# Patient Record
Sex: Male | Born: 1965
Health system: Southern US, Community
[De-identification: ages and names within clinical notes are randomized; demographics above are authoritative.]

## PROBLEM LIST (undated history)

## (undated) DIAGNOSIS — E119 Type 2 diabetes mellitus without complications: Secondary | ICD-10-CM

## (undated) DIAGNOSIS — I10 Essential (primary) hypertension: Secondary | ICD-10-CM

## (undated) DIAGNOSIS — I429 Cardiomyopathy, unspecified: Secondary | ICD-10-CM

## (undated) DIAGNOSIS — E08311 Diabetes mellitus due to underlying condition with unspecified diabetic retinopathy with macular edema: Secondary | ICD-10-CM

## (undated) DIAGNOSIS — R410 Disorientation, unspecified: Secondary | ICD-10-CM

## (undated) DIAGNOSIS — M542 Cervicalgia: Secondary | ICD-10-CM

## (undated) DIAGNOSIS — J449 Chronic obstructive pulmonary disease, unspecified: Secondary | ICD-10-CM

## (undated) DIAGNOSIS — E785 Hyperlipidemia, unspecified: Secondary | ICD-10-CM

## (undated) DIAGNOSIS — E782 Mixed hyperlipidemia: Secondary | ICD-10-CM

## (undated) DIAGNOSIS — M5412 Radiculopathy, cervical region: Secondary | ICD-10-CM

## (undated) HISTORY — DX: Essential (primary) hypertension: I10

## (undated) HISTORY — DX: Type 2 diabetes mellitus without complications: E11.9

## (undated) HISTORY — DX: Mixed hyperlipidemia: E78.2

## (undated) HISTORY — DX: Cardiomyopathy, unspecified: I42.9

## (undated) HISTORY — DX: Disorientation, unspecified: R41.0

## (undated) HISTORY — PX: CHOLECYSTECTOMY: SHX55

---
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
## 2013-12-17 DIAGNOSIS — L0231 Cutaneous abscess of buttock: Secondary | ICD-10-CM | POA: Diagnosis not present

## 2013-12-17 DIAGNOSIS — L03317 Cellulitis of buttock: Secondary | ICD-10-CM | POA: Diagnosis not present

## 2013-12-17 DIAGNOSIS — Z794 Long term (current) use of insulin: Secondary | ICD-10-CM | POA: Diagnosis not present

## 2013-12-17 DIAGNOSIS — E119 Type 2 diabetes mellitus without complications: Secondary | ICD-10-CM | POA: Diagnosis not present

## 2013-12-17 DIAGNOSIS — F17200 Nicotine dependence, unspecified, uncomplicated: Secondary | ICD-10-CM | POA: Diagnosis not present

## 2013-12-19 DIAGNOSIS — Z4800 Encounter for change or removal of nonsurgical wound dressing: Secondary | ICD-10-CM | POA: Diagnosis not present

## 2013-12-21 DIAGNOSIS — Z8249 Family history of ischemic heart disease and other diseases of the circulatory system: Secondary | ICD-10-CM | POA: Diagnosis not present

## 2013-12-21 DIAGNOSIS — L0231 Cutaneous abscess of buttock: Secondary | ICD-10-CM | POA: Diagnosis not present

## 2013-12-21 DIAGNOSIS — Z794 Long term (current) use of insulin: Secondary | ICD-10-CM | POA: Diagnosis not present

## 2013-12-21 DIAGNOSIS — L03317 Cellulitis of buttock: Secondary | ICD-10-CM | POA: Diagnosis not present

## 2013-12-21 DIAGNOSIS — Z833 Family history of diabetes mellitus: Secondary | ICD-10-CM | POA: Diagnosis not present

## 2013-12-21 DIAGNOSIS — I10 Essential (primary) hypertension: Secondary | ICD-10-CM | POA: Diagnosis not present

## 2013-12-21 DIAGNOSIS — E109 Type 1 diabetes mellitus without complications: Secondary | ICD-10-CM | POA: Diagnosis not present

## 2013-12-21 DIAGNOSIS — F17200 Nicotine dependence, unspecified, uncomplicated: Secondary | ICD-10-CM | POA: Diagnosis not present

## 2013-12-21 DIAGNOSIS — Z4800 Encounter for change or removal of nonsurgical wound dressing: Secondary | ICD-10-CM | POA: Diagnosis not present

## 2013-12-25 DIAGNOSIS — K5900 Constipation, unspecified: Secondary | ICD-10-CM | POA: Diagnosis not present

## 2013-12-25 DIAGNOSIS — I10 Essential (primary) hypertension: Secondary | ICD-10-CM | POA: Diagnosis not present

## 2013-12-25 DIAGNOSIS — Z794 Long term (current) use of insulin: Secondary | ICD-10-CM | POA: Diagnosis not present

## 2013-12-25 DIAGNOSIS — E109 Type 1 diabetes mellitus without complications: Secondary | ICD-10-CM | POA: Diagnosis not present

## 2013-12-25 DIAGNOSIS — F17200 Nicotine dependence, unspecified, uncomplicated: Secondary | ICD-10-CM | POA: Diagnosis not present

## 2013-12-25 DIAGNOSIS — Z8249 Family history of ischemic heart disease and other diseases of the circulatory system: Secondary | ICD-10-CM | POA: Diagnosis not present

## 2013-12-25 DIAGNOSIS — Z833 Family history of diabetes mellitus: Secondary | ICD-10-CM | POA: Diagnosis not present

## 2013-12-25 DIAGNOSIS — R1084 Generalized abdominal pain: Secondary | ICD-10-CM | POA: Diagnosis not present

## 2015-01-21 DIAGNOSIS — Z794 Long term (current) use of insulin: Secondary | ICD-10-CM | POA: Diagnosis not present

## 2015-01-21 DIAGNOSIS — H5441 Blindness, right eye, normal vision left eye: Secondary | ICD-10-CM | POA: Diagnosis not present

## 2015-01-21 DIAGNOSIS — Z833 Family history of diabetes mellitus: Secondary | ICD-10-CM | POA: Diagnosis not present

## 2015-01-21 DIAGNOSIS — F17200 Nicotine dependence, unspecified, uncomplicated: Secondary | ICD-10-CM | POA: Diagnosis not present

## 2015-01-21 DIAGNOSIS — I10 Essential (primary) hypertension: Secondary | ICD-10-CM | POA: Diagnosis not present

## 2015-01-21 DIAGNOSIS — H5712 Ocular pain, left eye: Secondary | ICD-10-CM | POA: Diagnosis not present

## 2015-01-21 DIAGNOSIS — E109 Type 1 diabetes mellitus without complications: Secondary | ICD-10-CM | POA: Diagnosis not present

## 2015-01-21 DIAGNOSIS — Z8249 Family history of ischemic heart disease and other diseases of the circulatory system: Secondary | ICD-10-CM | POA: Diagnosis not present

## 2015-01-22 DIAGNOSIS — H2512 Age-related nuclear cataract, left eye: Secondary | ICD-10-CM | POA: Diagnosis not present

## 2015-02-02 DIAGNOSIS — H2512 Age-related nuclear cataract, left eye: Secondary | ICD-10-CM | POA: Diagnosis not present

## 2015-02-17 DIAGNOSIS — H2512 Age-related nuclear cataract, left eye: Secondary | ICD-10-CM | POA: Diagnosis not present

## 2015-03-10 DIAGNOSIS — H3589 Other specified retinal disorders: Secondary | ICD-10-CM | POA: Diagnosis not present

## 2015-03-10 DIAGNOSIS — H2589 Other age-related cataract: Secondary | ICD-10-CM | POA: Diagnosis not present

## 2015-03-10 DIAGNOSIS — E11341 Type 2 diabetes mellitus with severe nonproliferative diabetic retinopathy with macular edema: Secondary | ICD-10-CM | POA: Diagnosis not present

## 2015-03-12 DIAGNOSIS — E11341 Type 2 diabetes mellitus with severe nonproliferative diabetic retinopathy with macular edema: Secondary | ICD-10-CM | POA: Diagnosis not present

## 2015-10-08 DIAGNOSIS — E784 Other hyperlipidemia: Secondary | ICD-10-CM | POA: Diagnosis not present

## 2015-10-08 DIAGNOSIS — I10 Essential (primary) hypertension: Secondary | ICD-10-CM | POA: Diagnosis not present

## 2015-10-08 DIAGNOSIS — E1165 Type 2 diabetes mellitus with hyperglycemia: Secondary | ICD-10-CM | POA: Diagnosis not present

## 2015-10-08 DIAGNOSIS — E559 Vitamin D deficiency, unspecified: Secondary | ICD-10-CM | POA: Diagnosis not present

## 2016-07-12 DIAGNOSIS — Z794 Long term (current) use of insulin: Secondary | ICD-10-CM | POA: Diagnosis not present

## 2016-07-12 DIAGNOSIS — F17200 Nicotine dependence, unspecified, uncomplicated: Secondary | ICD-10-CM | POA: Diagnosis not present

## 2016-07-12 DIAGNOSIS — M542 Cervicalgia: Secondary | ICD-10-CM | POA: Diagnosis not present

## 2016-07-12 DIAGNOSIS — M79622 Pain in left upper arm: Secondary | ICD-10-CM | POA: Diagnosis not present

## 2016-07-12 DIAGNOSIS — Z8249 Family history of ischemic heart disease and other diseases of the circulatory system: Secondary | ICD-10-CM | POA: Diagnosis not present

## 2016-07-12 DIAGNOSIS — E109 Type 1 diabetes mellitus without complications: Secondary | ICD-10-CM | POA: Diagnosis not present

## 2016-07-12 DIAGNOSIS — M25522 Pain in left elbow: Secondary | ICD-10-CM | POA: Diagnosis not present

## 2016-07-12 DIAGNOSIS — Z833 Family history of diabetes mellitus: Secondary | ICD-10-CM | POA: Diagnosis not present

## 2016-07-12 DIAGNOSIS — I10 Essential (primary) hypertension: Secondary | ICD-10-CM | POA: Diagnosis not present

## 2016-07-17 DIAGNOSIS — M25512 Pain in left shoulder: Secondary | ICD-10-CM | POA: Diagnosis not present

## 2016-07-17 DIAGNOSIS — Z833 Family history of diabetes mellitus: Secondary | ICD-10-CM | POA: Diagnosis not present

## 2016-07-17 DIAGNOSIS — M542 Cervicalgia: Secondary | ICD-10-CM | POA: Diagnosis not present

## 2016-07-17 DIAGNOSIS — Z8249 Family history of ischemic heart disease and other diseases of the circulatory system: Secondary | ICD-10-CM | POA: Diagnosis not present

## 2016-07-17 DIAGNOSIS — F17200 Nicotine dependence, unspecified, uncomplicated: Secondary | ICD-10-CM | POA: Diagnosis not present

## 2016-07-17 DIAGNOSIS — I10 Essential (primary) hypertension: Secondary | ICD-10-CM | POA: Diagnosis not present

## 2016-07-17 DIAGNOSIS — M5412 Radiculopathy, cervical region: Secondary | ICD-10-CM | POA: Diagnosis not present

## 2016-07-17 DIAGNOSIS — E109 Type 1 diabetes mellitus without complications: Secondary | ICD-10-CM | POA: Diagnosis not present

## 2016-08-03 DIAGNOSIS — M4722 Other spondylosis with radiculopathy, cervical region: Secondary | ICD-10-CM | POA: Diagnosis not present

## 2016-08-03 DIAGNOSIS — I10 Essential (primary) hypertension: Secondary | ICD-10-CM | POA: Diagnosis not present

## 2016-08-03 DIAGNOSIS — E784 Other hyperlipidemia: Secondary | ICD-10-CM | POA: Diagnosis not present

## 2016-08-03 DIAGNOSIS — E1165 Type 2 diabetes mellitus with hyperglycemia: Secondary | ICD-10-CM | POA: Diagnosis not present

## 2016-08-09 DIAGNOSIS — M9981 Other biomechanical lesions of cervical region: Secondary | ICD-10-CM | POA: Diagnosis not present

## 2016-08-09 DIAGNOSIS — M4722 Other spondylosis with radiculopathy, cervical region: Secondary | ICD-10-CM | POA: Diagnosis not present

## 2016-08-09 DIAGNOSIS — M50222 Other cervical disc displacement at C5-C6 level: Secondary | ICD-10-CM | POA: Diagnosis not present

## 2016-08-09 DIAGNOSIS — M50221 Other cervical disc displacement at C4-C5 level: Secondary | ICD-10-CM | POA: Diagnosis not present

## 2016-08-12 ENCOUNTER — Other Ambulatory Visit: Payer: Self-pay | Admitting: Internal Medicine

## 2016-08-12 DIAGNOSIS — M542 Cervicalgia: Secondary | ICD-10-CM

## 2016-08-18 ENCOUNTER — Ambulatory Visit
Admission: RE | Admit: 2016-08-18 | Discharge: 2016-08-18 | Disposition: A | Discharge status: Home / Self Care | Payer: Medicare Other | Source: Ambulatory Visit | Attending: Internal Medicine | Admitting: Internal Medicine

## 2016-08-18 DIAGNOSIS — M542 Cervicalgia: Secondary | ICD-10-CM

## 2016-08-18 DIAGNOSIS — M47812 Spondylosis without myelopathy or radiculopathy, cervical region: Secondary | ICD-10-CM | POA: Diagnosis not present

## 2016-08-18 MED ORDER — DIAZEPAM 5 MG PO TABS
10.0000 mg | ORAL_TABLET | Freq: Once | ORAL | Status: AC
  Administered 2016-08-18: 10 mg via ORAL

## 2016-08-18 MED ORDER — IOPAMIDOL (ISOVUE-M 300) INJECTION 61%
1.0000 mL | Freq: Once | INTRAMUSCULAR | Status: AC | PRN
  Administered 2016-08-18: 1 mL via EPIDURAL

## 2016-08-18 MED ORDER — TRIAMCINOLONE ACETONIDE 40 MG/ML IJ SUSP (RADIOLOGY)
60.0000 mg | Freq: Once | INTRAMUSCULAR | Status: AC
  Administered 2016-08-18: 60 mg via EPIDURAL

## 2016-08-18 NOTE — Discharge Instructions (Signed)

## 2016-09-07 DIAGNOSIS — Z79899 Other long term (current) drug therapy: Secondary | ICD-10-CM | POA: Diagnosis not present

## 2016-09-07 DIAGNOSIS — Z8249 Family history of ischemic heart disease and other diseases of the circulatory system: Secondary | ICD-10-CM | POA: Diagnosis not present

## 2016-09-07 DIAGNOSIS — E1065 Type 1 diabetes mellitus with hyperglycemia: Secondary | ICD-10-CM | POA: Diagnosis not present

## 2016-09-07 DIAGNOSIS — I10 Essential (primary) hypertension: Secondary | ICD-10-CM | POA: Diagnosis not present

## 2016-09-07 DIAGNOSIS — M542 Cervicalgia: Secondary | ICD-10-CM | POA: Diagnosis not present

## 2016-09-07 DIAGNOSIS — R0602 Shortness of breath: Secondary | ICD-10-CM | POA: Diagnosis not present

## 2016-09-07 DIAGNOSIS — Z833 Family history of diabetes mellitus: Secondary | ICD-10-CM | POA: Diagnosis not present

## 2016-09-07 DIAGNOSIS — F17200 Nicotine dependence, unspecified, uncomplicated: Secondary | ICD-10-CM | POA: Diagnosis not present

## 2016-09-08 DIAGNOSIS — M4722 Other spondylosis with radiculopathy, cervical region: Secondary | ICD-10-CM | POA: Diagnosis not present

## 2016-09-08 DIAGNOSIS — Z125 Encounter for screening for malignant neoplasm of prostate: Secondary | ICD-10-CM | POA: Diagnosis not present

## 2016-09-08 DIAGNOSIS — I10 Essential (primary) hypertension: Secondary | ICD-10-CM | POA: Diagnosis not present

## 2016-09-08 DIAGNOSIS — E1165 Type 2 diabetes mellitus with hyperglycemia: Secondary | ICD-10-CM | POA: Diagnosis not present

## 2016-09-08 DIAGNOSIS — E784 Other hyperlipidemia: Secondary | ICD-10-CM | POA: Diagnosis not present

## 2016-09-08 DIAGNOSIS — R1319 Other dysphagia: Secondary | ICD-10-CM | POA: Diagnosis not present

## 2016-09-30 DIAGNOSIS — I10 Essential (primary) hypertension: Secondary | ICD-10-CM | POA: Diagnosis not present

## 2016-09-30 DIAGNOSIS — E113412 Type 2 diabetes mellitus with severe nonproliferative diabetic retinopathy with macular edema, left eye: Secondary | ICD-10-CM | POA: Diagnosis not present

## 2016-10-03 DIAGNOSIS — E113412 Type 2 diabetes mellitus with severe nonproliferative diabetic retinopathy with macular edema, left eye: Secondary | ICD-10-CM | POA: Diagnosis not present

## 2016-11-07 DIAGNOSIS — E113412 Type 2 diabetes mellitus with severe nonproliferative diabetic retinopathy with macular edema, left eye: Secondary | ICD-10-CM | POA: Diagnosis not present

## 2016-12-12 DIAGNOSIS — E113412 Type 2 diabetes mellitus with severe nonproliferative diabetic retinopathy with macular edema, left eye: Secondary | ICD-10-CM | POA: Diagnosis not present

## 2016-12-13 DIAGNOSIS — E784 Other hyperlipidemia: Secondary | ICD-10-CM | POA: Diagnosis not present

## 2016-12-13 DIAGNOSIS — M4722 Other spondylosis with radiculopathy, cervical region: Secondary | ICD-10-CM | POA: Diagnosis not present

## 2016-12-13 DIAGNOSIS — I10 Essential (primary) hypertension: Secondary | ICD-10-CM | POA: Diagnosis not present

## 2016-12-13 DIAGNOSIS — E1165 Type 2 diabetes mellitus with hyperglycemia: Secondary | ICD-10-CM | POA: Diagnosis not present

## 2016-12-20 DIAGNOSIS — M545 Low back pain: Secondary | ICD-10-CM | POA: Diagnosis not present

## 2017-01-16 DIAGNOSIS — E113412 Type 2 diabetes mellitus with severe nonproliferative diabetic retinopathy with macular edema, left eye: Secondary | ICD-10-CM | POA: Diagnosis not present

## 2017-02-20 DIAGNOSIS — E113412 Type 2 diabetes mellitus with severe nonproliferative diabetic retinopathy with macular edema, left eye: Secondary | ICD-10-CM | POA: Diagnosis not present

## 2017-06-22 DIAGNOSIS — E109 Type 1 diabetes mellitus without complications: Secondary | ICD-10-CM | POA: Diagnosis not present

## 2017-06-22 DIAGNOSIS — M79622 Pain in left upper arm: Secondary | ICD-10-CM | POA: Diagnosis not present

## 2017-06-22 DIAGNOSIS — Z79899 Other long term (current) drug therapy: Secondary | ICD-10-CM | POA: Diagnosis not present

## 2017-06-22 DIAGNOSIS — Z7984 Long term (current) use of oral hypoglycemic drugs: Secondary | ICD-10-CM | POA: Diagnosis not present

## 2017-06-22 DIAGNOSIS — M542 Cervicalgia: Secondary | ICD-10-CM | POA: Diagnosis not present

## 2017-06-22 DIAGNOSIS — F17200 Nicotine dependence, unspecified, uncomplicated: Secondary | ICD-10-CM | POA: Diagnosis not present

## 2017-06-22 DIAGNOSIS — M5412 Radiculopathy, cervical region: Secondary | ICD-10-CM | POA: Diagnosis not present

## 2017-06-22 DIAGNOSIS — I10 Essential (primary) hypertension: Secondary | ICD-10-CM | POA: Diagnosis not present

## 2017-06-27 DIAGNOSIS — E1165 Type 2 diabetes mellitus with hyperglycemia: Secondary | ICD-10-CM | POA: Diagnosis not present

## 2017-06-27 DIAGNOSIS — I10 Essential (primary) hypertension: Secondary | ICD-10-CM | POA: Diagnosis not present

## 2017-06-27 DIAGNOSIS — Z79899 Other long term (current) drug therapy: Secondary | ICD-10-CM | POA: Diagnosis not present

## 2017-06-27 DIAGNOSIS — E7849 Other hyperlipidemia: Secondary | ICD-10-CM | POA: Diagnosis not present

## 2017-06-27 DIAGNOSIS — M4722 Other spondylosis with radiculopathy, cervical region: Secondary | ICD-10-CM | POA: Diagnosis not present

## 2017-07-04 DIAGNOSIS — M50223 Other cervical disc displacement at C6-C7 level: Secondary | ICD-10-CM | POA: Diagnosis not present

## 2017-07-04 DIAGNOSIS — M542 Cervicalgia: Secondary | ICD-10-CM | POA: Diagnosis not present

## 2017-07-04 DIAGNOSIS — M4722 Other spondylosis with radiculopathy, cervical region: Secondary | ICD-10-CM | POA: Diagnosis not present

## 2017-07-04 DIAGNOSIS — M4802 Spinal stenosis, cervical region: Secondary | ICD-10-CM | POA: Diagnosis not present

## 2017-12-21 DIAGNOSIS — I10 Essential (primary) hypertension: Secondary | ICD-10-CM | POA: Diagnosis not present

## 2017-12-21 DIAGNOSIS — E1142 Type 2 diabetes mellitus with diabetic polyneuropathy: Secondary | ICD-10-CM | POA: Diagnosis not present

## 2017-12-21 DIAGNOSIS — M4722 Other spondylosis with radiculopathy, cervical region: Secondary | ICD-10-CM | POA: Diagnosis not present

## 2017-12-21 DIAGNOSIS — E7849 Other hyperlipidemia: Secondary | ICD-10-CM | POA: Diagnosis not present

## 2018-01-30 DIAGNOSIS — Z7984 Long term (current) use of oral hypoglycemic drugs: Secondary | ICD-10-CM | POA: Diagnosis not present

## 2018-01-30 DIAGNOSIS — H5712 Ocular pain, left eye: Secondary | ICD-10-CM | POA: Diagnosis not present

## 2018-01-30 DIAGNOSIS — I10 Essential (primary) hypertension: Secondary | ICD-10-CM | POA: Diagnosis not present

## 2018-01-30 DIAGNOSIS — Z79899 Other long term (current) drug therapy: Secondary | ICD-10-CM | POA: Diagnosis not present

## 2018-01-30 DIAGNOSIS — Z833 Family history of diabetes mellitus: Secondary | ICD-10-CM | POA: Diagnosis not present

## 2018-01-30 DIAGNOSIS — H5461 Unqualified visual loss, right eye, normal vision left eye: Secondary | ICD-10-CM | POA: Diagnosis not present

## 2018-01-30 DIAGNOSIS — H16002 Unspecified corneal ulcer, left eye: Secondary | ICD-10-CM | POA: Diagnosis not present

## 2018-01-30 DIAGNOSIS — Z8249 Family history of ischemic heart disease and other diseases of the circulatory system: Secondary | ICD-10-CM | POA: Diagnosis not present

## 2018-01-30 DIAGNOSIS — H579 Unspecified disorder of eye and adnexa: Secondary | ICD-10-CM | POA: Diagnosis not present

## 2018-01-30 DIAGNOSIS — H269 Unspecified cataract: Secondary | ICD-10-CM | POA: Diagnosis not present

## 2018-01-30 DIAGNOSIS — F17200 Nicotine dependence, unspecified, uncomplicated: Secondary | ICD-10-CM | POA: Diagnosis not present

## 2018-01-30 DIAGNOSIS — E109 Type 1 diabetes mellitus without complications: Secondary | ICD-10-CM | POA: Diagnosis not present

## 2018-01-31 DIAGNOSIS — H209 Unspecified iridocyclitis: Secondary | ICD-10-CM | POA: Diagnosis not present

## 2018-02-01 DIAGNOSIS — H16002 Unspecified corneal ulcer, left eye: Secondary | ICD-10-CM | POA: Diagnosis not present

## 2018-02-14 DIAGNOSIS — H16002 Unspecified corneal ulcer, left eye: Secondary | ICD-10-CM | POA: Diagnosis not present

## 2018-03-07 DIAGNOSIS — E113512 Type 2 diabetes mellitus with proliferative diabetic retinopathy with macular edema, left eye: Secondary | ICD-10-CM | POA: Diagnosis not present

## 2018-04-11 DIAGNOSIS — E113512 Type 2 diabetes mellitus with proliferative diabetic retinopathy with macular edema, left eye: Secondary | ICD-10-CM | POA: Diagnosis not present

## 2018-06-30 IMAGING — XA DG INJECT/[PERSON_NAME] INC NEEDLE/CATH/PLC EPI/CERV/THOR W/IMG
2 series · 2 of 2 positions shown · non-contrast
Comparison: none

CLINICAL DATA: Spondylosis without myelopathy. Left-sided disc
C5-6. Left shoulder and arm pain and weakness.

[Series 1: ortho standard · 1 of 1 slices shown (1 of 2)]
[im 1/1]
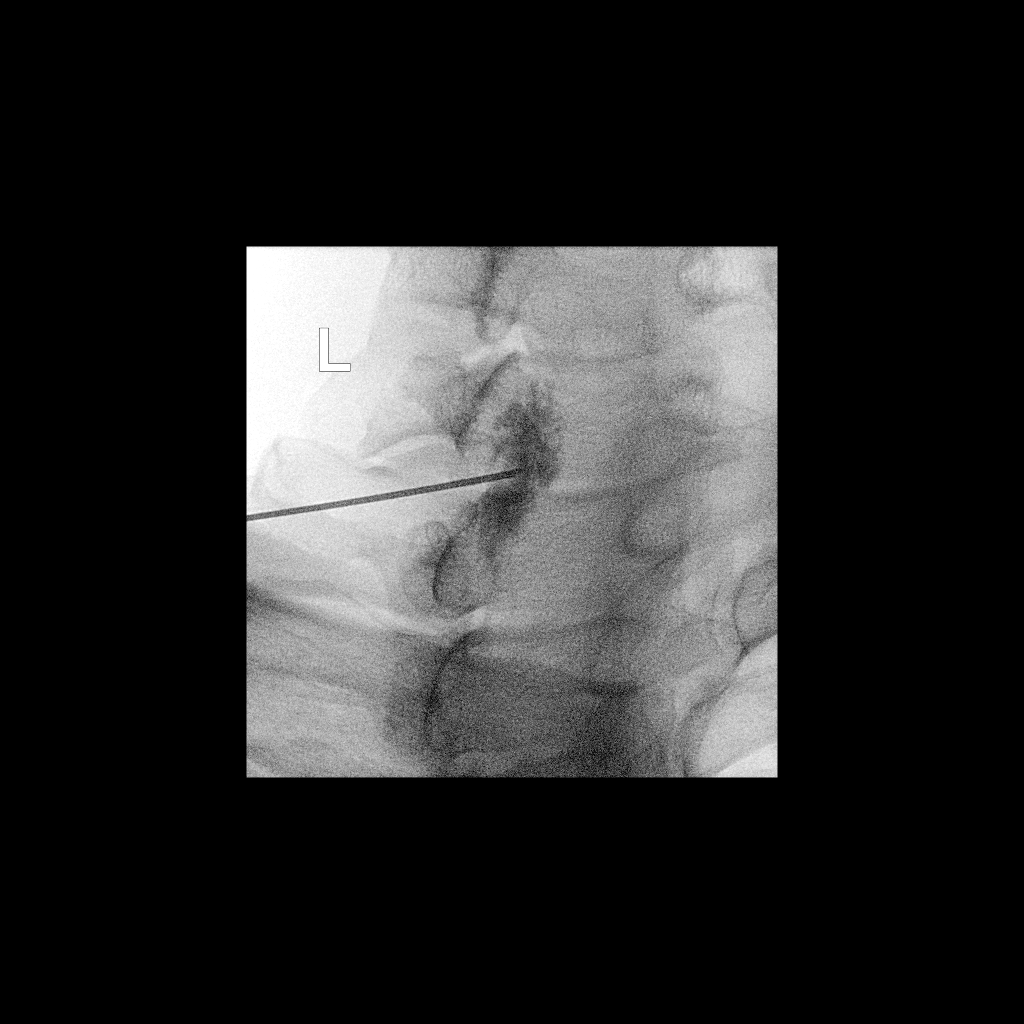

[Series 2: ortho standard · 1 of 1 slices shown (2 of 2)]
[im 1/1]
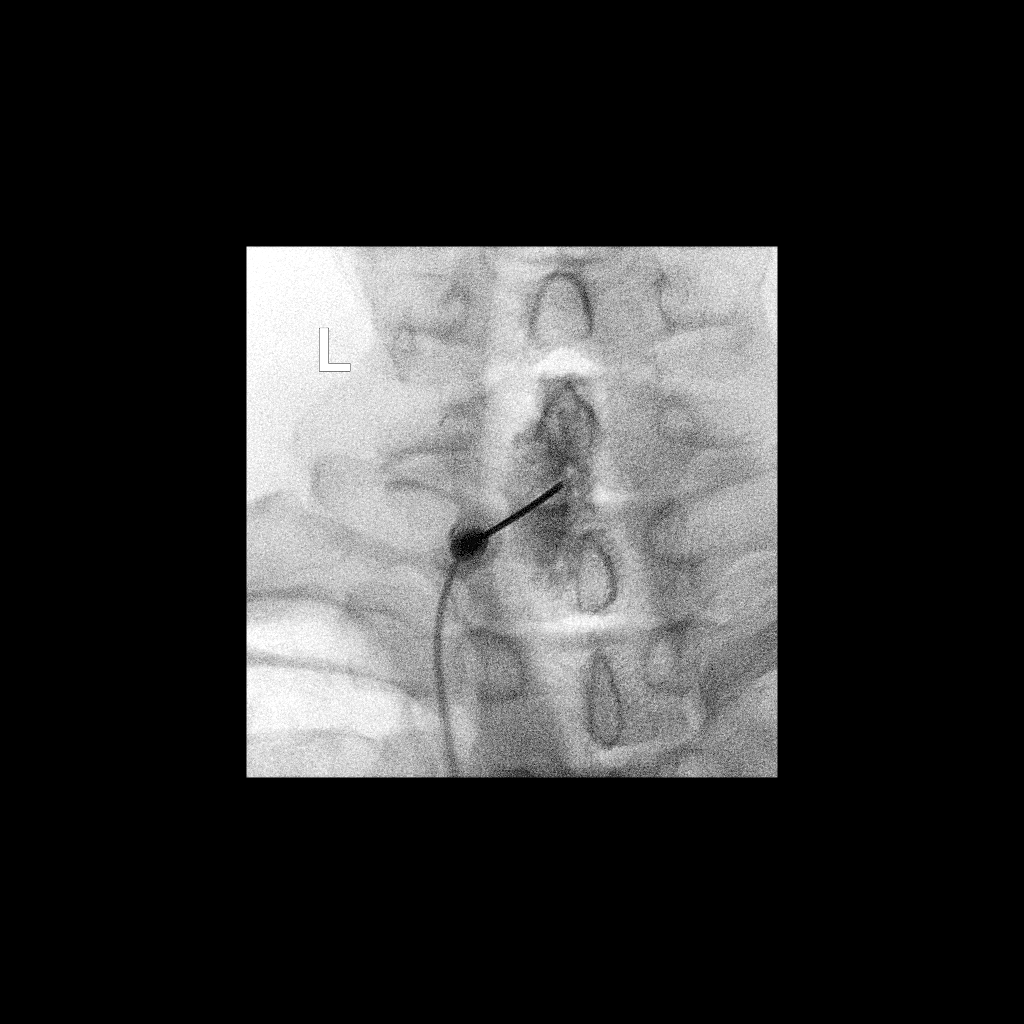

[2 of 2 positions shown; findings below may reference images not displayed]

FLUOROSCOPY TIME:  0 minutes 26 seconds. 7.84 micro gray meter
squared

PROCEDURE:
CERVICAL EPIDURAL INJECTION

An interlaminar approach was performed on the left at C7 . A 20
gauge epidural needle was advanced using loss-of-resistance
technique.

DIAGNOSTIC EPIDURAL INJECTION

Injection of Isovue-M 300 shows a good epidural pattern with spread
above and below the level of needle placement, primarily on the
left. No vascular opacification is seen. THERAPEUTIC

EPIDURAL INJECTION

1.5 ml of Kenalog 40 mixed with 1 ml of 1% Lidocaine and 2 ml of
normal saline were then instilled. The procedure was well-tolerated,
and the patient was discharged thirty minutes following the
injection in good condition.
IMPRESSION: Technically successful initial epidural injection on the left at C7.

## 2019-08-19 DIAGNOSIS — I5040 Unspecified combined systolic (congestive) and diastolic (congestive) heart failure: Secondary | ICD-10-CM | POA: Diagnosis not present

## 2019-08-25 DIAGNOSIS — Z20822 Contact with and (suspected) exposure to covid-19: Secondary | ICD-10-CM | POA: Diagnosis present

## 2019-08-25 DIAGNOSIS — F209 Schizophrenia, unspecified: Secondary | ICD-10-CM | POA: Diagnosis present

## 2019-08-25 DIAGNOSIS — F17200 Nicotine dependence, unspecified, uncomplicated: Secondary | ICD-10-CM | POA: Diagnosis present

## 2019-08-25 DIAGNOSIS — I509 Heart failure, unspecified: Secondary | ICD-10-CM | POA: Diagnosis not present

## 2019-08-25 DIAGNOSIS — Z720 Tobacco use: Secondary | ICD-10-CM | POA: Diagnosis not present

## 2019-08-25 DIAGNOSIS — N182 Chronic kidney disease, stage 2 (mild): Secondary | ICD-10-CM | POA: Diagnosis present

## 2019-08-25 DIAGNOSIS — Z818 Family history of other mental and behavioral disorders: Secondary | ICD-10-CM | POA: Diagnosis not present

## 2019-08-25 DIAGNOSIS — R0602 Shortness of breath: Secondary | ICD-10-CM | POA: Diagnosis not present

## 2019-08-25 DIAGNOSIS — I5021 Acute systolic (congestive) heart failure: Secondary | ICD-10-CM | POA: Diagnosis present

## 2019-08-25 DIAGNOSIS — Z7984 Long term (current) use of oral hypoglycemic drugs: Secondary | ICD-10-CM | POA: Diagnosis not present

## 2019-08-25 DIAGNOSIS — I130 Hypertensive heart and chronic kidney disease with heart failure and stage 1 through stage 4 chronic kidney disease, or unspecified chronic kidney disease: Secondary | ICD-10-CM | POA: Diagnosis present

## 2019-08-25 DIAGNOSIS — J189 Pneumonia, unspecified organism: Secondary | ICD-10-CM | POA: Diagnosis not present

## 2019-08-25 DIAGNOSIS — E1122 Type 2 diabetes mellitus with diabetic chronic kidney disease: Secondary | ICD-10-CM | POA: Diagnosis present

## 2019-08-25 DIAGNOSIS — E10649 Type 1 diabetes mellitus with hypoglycemia without coma: Secondary | ICD-10-CM | POA: Diagnosis not present

## 2019-08-25 DIAGNOSIS — E1140 Type 2 diabetes mellitus with diabetic neuropathy, unspecified: Secondary | ICD-10-CM | POA: Diagnosis present

## 2019-08-25 DIAGNOSIS — E11649 Type 2 diabetes mellitus with hypoglycemia without coma: Secondary | ICD-10-CM | POA: Diagnosis present

## 2019-08-25 DIAGNOSIS — Z833 Family history of diabetes mellitus: Secondary | ICD-10-CM | POA: Diagnosis not present

## 2019-08-27 DIAGNOSIS — I509 Heart failure, unspecified: Secondary | ICD-10-CM | POA: Diagnosis not present

## 2019-09-05 DIAGNOSIS — N3942 Incontinence without sensory awareness: Secondary | ICD-10-CM | POA: Diagnosis not present

## 2019-09-05 DIAGNOSIS — J17 Pneumonia in diseases classified elsewhere: Secondary | ICD-10-CM | POA: Diagnosis not present

## 2019-09-05 DIAGNOSIS — I5022 Chronic systolic (congestive) heart failure: Secondary | ICD-10-CM | POA: Diagnosis not present

## 2019-09-07 DIAGNOSIS — R0602 Shortness of breath: Secondary | ICD-10-CM | POA: Diagnosis not present

## 2019-09-07 DIAGNOSIS — I5040 Unspecified combined systolic (congestive) and diastolic (congestive) heart failure: Secondary | ICD-10-CM | POA: Diagnosis not present

## 2019-09-07 DIAGNOSIS — I6389 Other cerebral infarction: Secondary | ICD-10-CM | POA: Diagnosis not present

## 2019-09-08 DIAGNOSIS — I5023 Acute on chronic systolic (congestive) heart failure: Secondary | ICD-10-CM | POA: Diagnosis not present

## 2019-09-08 DIAGNOSIS — F209 Schizophrenia, unspecified: Secondary | ICD-10-CM | POA: Diagnosis not present

## 2019-09-08 DIAGNOSIS — I130 Hypertensive heart and chronic kidney disease with heart failure and stage 1 through stage 4 chronic kidney disease, or unspecified chronic kidney disease: Secondary | ICD-10-CM | POA: Diagnosis not present

## 2019-09-08 DIAGNOSIS — J189 Pneumonia, unspecified organism: Secondary | ICD-10-CM | POA: Diagnosis not present

## 2019-09-08 DIAGNOSIS — N182 Chronic kidney disease, stage 2 (mild): Secondary | ICD-10-CM | POA: Diagnosis not present

## 2019-09-09 DIAGNOSIS — I5023 Acute on chronic systolic (congestive) heart failure: Secondary | ICD-10-CM | POA: Diagnosis not present

## 2019-09-09 DIAGNOSIS — E1040 Type 1 diabetes mellitus with diabetic neuropathy, unspecified: Secondary | ICD-10-CM | POA: Diagnosis not present

## 2019-09-09 DIAGNOSIS — E1022 Type 1 diabetes mellitus with diabetic chronic kidney disease: Secondary | ICD-10-CM | POA: Diagnosis not present

## 2019-09-09 DIAGNOSIS — Z7984 Long term (current) use of oral hypoglycemic drugs: Secondary | ICD-10-CM | POA: Diagnosis not present

## 2019-09-09 DIAGNOSIS — Z20822 Contact with and (suspected) exposure to covid-19: Secondary | ICD-10-CM | POA: Diagnosis not present

## 2019-09-09 DIAGNOSIS — F209 Schizophrenia, unspecified: Secondary | ICD-10-CM | POA: Diagnosis not present

## 2019-09-09 DIAGNOSIS — J189 Pneumonia, unspecified organism: Secondary | ICD-10-CM | POA: Diagnosis not present

## 2019-09-09 DIAGNOSIS — I509 Heart failure, unspecified: Secondary | ICD-10-CM | POA: Diagnosis not present

## 2019-09-09 DIAGNOSIS — I130 Hypertensive heart and chronic kidney disease with heart failure and stage 1 through stage 4 chronic kidney disease, or unspecified chronic kidney disease: Secondary | ICD-10-CM | POA: Diagnosis not present

## 2019-09-09 DIAGNOSIS — N182 Chronic kidney disease, stage 2 (mild): Secondary | ICD-10-CM | POA: Diagnosis not present

## 2019-09-10 DIAGNOSIS — J189 Pneumonia, unspecified organism: Secondary | ICD-10-CM | POA: Diagnosis not present

## 2019-09-10 DIAGNOSIS — N182 Chronic kidney disease, stage 2 (mild): Secondary | ICD-10-CM | POA: Diagnosis not present

## 2019-09-10 DIAGNOSIS — I5023 Acute on chronic systolic (congestive) heart failure: Secondary | ICD-10-CM | POA: Diagnosis not present

## 2019-09-10 DIAGNOSIS — F209 Schizophrenia, unspecified: Secondary | ICD-10-CM | POA: Diagnosis not present

## 2019-09-10 DIAGNOSIS — I130 Hypertensive heart and chronic kidney disease with heart failure and stage 1 through stage 4 chronic kidney disease, or unspecified chronic kidney disease: Secondary | ICD-10-CM | POA: Diagnosis not present

## 2019-09-11 DIAGNOSIS — F209 Schizophrenia, unspecified: Secondary | ICD-10-CM | POA: Diagnosis not present

## 2019-09-11 DIAGNOSIS — N182 Chronic kidney disease, stage 2 (mild): Secondary | ICD-10-CM | POA: Diagnosis not present

## 2019-09-11 DIAGNOSIS — I5023 Acute on chronic systolic (congestive) heart failure: Secondary | ICD-10-CM | POA: Diagnosis not present

## 2019-09-11 DIAGNOSIS — I130 Hypertensive heart and chronic kidney disease with heart failure and stage 1 through stage 4 chronic kidney disease, or unspecified chronic kidney disease: Secondary | ICD-10-CM | POA: Diagnosis not present

## 2019-09-11 DIAGNOSIS — J189 Pneumonia, unspecified organism: Secondary | ICD-10-CM | POA: Diagnosis not present

## 2019-09-16 ENCOUNTER — Encounter (HOSPITAL_COMMUNITY): Payer: Self-pay

## 2019-09-16 ENCOUNTER — Emergency Department (HOSPITAL_COMMUNITY): Payer: Medicare HMO

## 2019-09-16 ENCOUNTER — Emergency Department (HOSPITAL_COMMUNITY)
Admission: EM | Admit: 2019-09-16 | Discharge: 2019-09-17 | Disposition: A | Discharge status: Home / Self Care | Payer: Medicare HMO | Attending: Emergency Medicine | Admitting: Emergency Medicine

## 2019-09-16 ENCOUNTER — Other Ambulatory Visit: Payer: Self-pay

## 2019-09-16 DIAGNOSIS — R079 Chest pain, unspecified: Secondary | ICD-10-CM | POA: Diagnosis present

## 2019-09-16 DIAGNOSIS — R270 Ataxia, unspecified: Secondary | ICD-10-CM | POA: Diagnosis not present

## 2019-09-16 DIAGNOSIS — F0390 Unspecified dementia without behavioral disturbance: Secondary | ICD-10-CM | POA: Insufficient documentation

## 2019-09-16 DIAGNOSIS — E11649 Type 2 diabetes mellitus with hypoglycemia without coma: Secondary | ICD-10-CM | POA: Diagnosis not present

## 2019-09-16 DIAGNOSIS — I10 Essential (primary) hypertension: Secondary | ICD-10-CM | POA: Diagnosis not present

## 2019-09-16 DIAGNOSIS — J449 Chronic obstructive pulmonary disease, unspecified: Secondary | ICD-10-CM | POA: Diagnosis not present

## 2019-09-16 DIAGNOSIS — E11311 Type 2 diabetes mellitus with unspecified diabetic retinopathy with macular edema: Secondary | ICD-10-CM | POA: Insufficient documentation

## 2019-09-16 DIAGNOSIS — Z7984 Long term (current) use of oral hypoglycemic drugs: Secondary | ICD-10-CM | POA: Insufficient documentation

## 2019-09-16 DIAGNOSIS — E162 Hypoglycemia, unspecified: Secondary | ICD-10-CM

## 2019-09-16 DIAGNOSIS — Z79899 Other long term (current) drug therapy: Secondary | ICD-10-CM | POA: Insufficient documentation

## 2019-09-16 DIAGNOSIS — M79603 Pain in arm, unspecified: Secondary | ICD-10-CM | POA: Diagnosis not present

## 2019-09-16 HISTORY — DX: Chronic obstructive pulmonary disease, unspecified: J44.9

## 2019-09-16 HISTORY — DX: Diabetes mellitus due to underlying condition with unspecified diabetic retinopathy with macular edema: E08.311

## 2019-09-16 HISTORY — DX: Radiculopathy, cervical region: M54.12

## 2019-09-16 LAB — CBC
HCT: 38.8 % — ABNORMAL LOW (ref 39.0–52.0)
Hemoglobin: 11.7 g/dL — ABNORMAL LOW (ref 13.0–17.0)
MCH: 24.7 pg — ABNORMAL LOW (ref 26.0–34.0)
MCHC: 30.2 g/dL (ref 30.0–36.0)
MCV: 81.9 fL (ref 80.0–100.0)
Platelets: 284 10*3/uL (ref 150–400)
RBC: 4.74 MIL/uL (ref 4.22–5.81)
RDW: 15.5 % (ref 11.5–15.5)
WBC: 6.4 10*3/uL (ref 4.0–10.5)
nRBC: 0 % (ref 0.0–0.2)

## 2019-09-16 LAB — BASIC METABOLIC PANEL
Anion gap: 9 (ref 5–15)
BUN: 53 mg/dL — ABNORMAL HIGH (ref 6–20)
CO2: 27 mmol/L (ref 22–32)
Calcium: 9.7 mg/dL (ref 8.9–10.3)
Chloride: 99 mmol/L (ref 98–111)
Creatinine, Ser: 2.67 mg/dL — ABNORMAL HIGH (ref 0.61–1.24)
GFR calc Af Amer: 31 mL/min — ABNORMAL LOW (ref >60)
GFR calc non Af Amer: 26 mL/min — ABNORMAL LOW (ref >60)
Glucose, Bld: 58 mg/dL — ABNORMAL LOW (ref 70–99)
Potassium: 4.1 mmol/L (ref 3.5–5.1)
Sodium: 135 mmol/L (ref 135–145)

## 2019-09-16 LAB — TROPONIN I (HIGH SENSITIVITY)
Troponin I (High Sensitivity): 10 ng/L (ref <18)
Troponin I (High Sensitivity): 9 ng/L (ref <18)

## 2019-09-16 LAB — CBG MONITORING, ED
Glucose-Capillary: 58 mg/dL — ABNORMAL LOW (ref 70–99)
Glucose-Capillary: 99 mg/dL (ref 70–99)

## 2019-09-16 MED ORDER — DEXTROSE 50 % IV SOLN: 50.0000 mL | Freq: Once | INTRAVENOUS | Status: AC

## 2019-09-16 MED ORDER — DEXTROSE 50 % IV SOLN
INTRAVENOUS | Status: AC
  Administered 2019-09-16: 22:00:00 25 mL via INTRAVENOUS
  Filled 2019-09-16: qty 50

## 2019-09-16 NOTE — Discharge Instructions (Addendum)
Stop taking the Glucotrol.  Make sure you eat frequent meals.  Follow-up Wednesday as planned

## 2019-09-16 NOTE — ED Triage Notes (Signed)
Treated for PNA since December.  Released from [facility] last Wednesday.  C/o left arm since 2 pm.  Notice in November that pt was having trouble mental function.  Pt is not dong appropriate actions.  Difficulty to get history from wife.  Wife says she has been taking Geodon twice daily but only taken once today.

## 2019-09-16 NOTE — ED Notes (Signed)
Pt given 4oz of orange juice for glucose of 58.

## 2019-09-16 NOTE — ED Notes (Signed)
Pt leaned over table in room, in obvious pain, pt won't verbalize or state where pain is. Wife states he was complaining of left arm pain earlier today. Cardiac monitors applied to pt. EKG done.

## 2019-09-17 DIAGNOSIS — W1830XA Fall on same level, unspecified, initial encounter: Secondary | ICD-10-CM | POA: Diagnosis not present

## 2019-09-17 DIAGNOSIS — Z7984 Long term (current) use of oral hypoglycemic drugs: Secondary | ICD-10-CM | POA: Diagnosis not present

## 2019-09-17 DIAGNOSIS — Z87891 Personal history of nicotine dependence: Secondary | ICD-10-CM | POA: Diagnosis not present

## 2019-09-17 DIAGNOSIS — Z9049 Acquired absence of other specified parts of digestive tract: Secondary | ICD-10-CM | POA: Diagnosis not present

## 2019-09-17 DIAGNOSIS — E109 Type 1 diabetes mellitus without complications: Secondary | ICD-10-CM | POA: Diagnosis not present

## 2019-09-17 DIAGNOSIS — Z8249 Family history of ischemic heart disease and other diseases of the circulatory system: Secondary | ICD-10-CM | POA: Diagnosis not present

## 2019-09-17 DIAGNOSIS — S42402A Unspecified fracture of lower end of left humerus, initial encounter for closed fracture: Secondary | ICD-10-CM | POA: Diagnosis not present

## 2019-09-17 DIAGNOSIS — I10 Essential (primary) hypertension: Secondary | ICD-10-CM | POA: Diagnosis not present

## 2019-09-17 DIAGNOSIS — Z79899 Other long term (current) drug therapy: Secondary | ICD-10-CM | POA: Diagnosis not present

## 2019-09-17 DIAGNOSIS — Z833 Family history of diabetes mellitus: Secondary | ICD-10-CM | POA: Diagnosis not present

## 2019-09-17 DIAGNOSIS — S6992XA Unspecified injury of left wrist, hand and finger(s), initial encounter: Secondary | ICD-10-CM | POA: Diagnosis not present

## 2019-09-17 DIAGNOSIS — S42472A Displaced transcondylar fracture of left humerus, initial encounter for closed fracture: Secondary | ICD-10-CM | POA: Diagnosis not present

## 2019-09-17 DIAGNOSIS — S79912A Unspecified injury of left hip, initial encounter: Secondary | ICD-10-CM | POA: Diagnosis not present

## 2019-09-17 NOTE — ED Provider Notes (Signed)
[facility] EMERGENCY DEPARTMENT Provider Note   CSN: [id number] Arrival date & time: 09/16/19  1722     History Chief Complaint  Patient presents with  . Congestive Heart Failure    Luis Riley is a 54 y.o. male.  Wife states patient patient usually does not say very much and she states that seems to be having some chest discomfort.  The history is provided by a relative. No language interpreter was used.  Altered Mental Status Presenting symptoms: behavior changes   Severity:  Mild Most recent episode:  Today Episode history:  Multiple Timing:  Constant Progression:  Waxing and waning Chronicity:  New      Past Medical History:  Diagnosis Date  . Cervicalgia   . COPD (chronic obstructive pulmonary disease) ([place])   . Diabetes mellitus without complication ([place])   . Diabetic retinopathy of left eye with macular edema associated with diabetes mellitus due to underlying condition ([place])   . Dyslipidemia   . Hypertension   . Radiculopathy of cervical region     There are no problems to display for this patient.        No family history on file.  Social History   Tobacco Use  . Smoking status: Not on file  Substance Use Topics  . Alcohol use: Not on file  . Drug use: Not on file    Home Medications Prior to Admission medications   Medication Sig Start Date End Date Taking? Authorizing Provider  amLODipine (NORVASC) 5 MG tablet Take 5 mg by mouth daily. 08/06/19  Yes [provider]  atorvastatin (LIPITOR) 20 MG tablet Take 20 mg by mouth every morning.  08/29/19  Yes [provider]  doxycycline (VIBRA-TABS) 100 MG tablet Take 100 mg by mouth 2 (two) times daily. 7 day course prescribed on 09/11/2019 09/11/19  Yes [provider]  furosemide (LASIX) 20 MG tablet Take 20 mg by mouth 2 (two) times daily.  08/19/19  Yes [provider]  gabapentin (NEURONTIN) 300 MG capsule Take 300 mg by mouth 3 (three) times daily as needed  (for nerve pain).    Yes [provider]  glipiZIDE (GLUCOTROL) 5 MG tablet Take 5 mg by mouth 2 (two) times daily. 07/28/19  Yes [provider]  lisinopril (ZESTRIL) 20 MG tablet Take 20 mg by mouth daily. 07/28/19  Yes [provider]  ziprasidone (GEODON) 20 MG capsule Take 20 mg by mouth 2 (two) times daily. 09/13/19  Yes [provider]    Allergies    Patient has no known allergies.  Review of Systems   Review of Systems  Unable to perform ROS: Dementia    Physical Exam Updated Vital Signs BP 112/71 (BP Location: Right Arm)   Pulse 76   Temp 98 F (36.7 C) (Oral)   Resp 15   Ht 5\' 2"  (1.575 m)   Wt 79.4 kg   SpO2 100%   BMI 32.01 kg/m   Physical Exam Vitals reviewed.  Constitutional:      Appearance: He is well-developed.  HENT:     Head: Normocephalic.     Nose: Nose normal.  Eyes:     General: No scleral icterus.    Conjunctiva/sclera: Conjunctivae normal.  Neck:     Thyroid: No thyromegaly.  Cardiovascular:     Rate and Rhythm: Normal rate and regular rhythm.     Heart sounds: No murmur. No friction rub. No gallop.   Pulmonary:     Breath sounds:  No stridor. No wheezing or rales.  Chest:     Chest wall: No tenderness.  Abdominal:     General: There is no distension.     Tenderness: There is no abdominal tenderness. There is no rebound.  Musculoskeletal:        General: Normal range of motion.     Cervical back: Neck supple.  Lymphadenopathy:     Cervical: No cervical adenopathy.  Skin:    Findings: No erythema or rash.  Neurological:     Mental Status: He is alert.     Motor: No abnormal muscle tone.     Coordination: Coordination normal.     Comments: Patient only says a few words answer questions but is refusing to talk much     ED Results / Procedures / Treatments   Labs (all labs ordered are listed, but only abnormal results are displayed) Labs Reviewed  BASIC METABOLIC PANEL - Abnormal; Notable for  the following components:      Result Value   Glucose, Bld 58 (*)    BUN 53 (*)    Creatinine, Ser 2.67 (*)    GFR calc non Af Amer 26 (*)    GFR calc Af Amer 31 (*)    All other components within normal limits  CBC - Abnormal; Notable for the following components:   Hemoglobin 11.7 (*)    HCT 38.8 (*)    MCH 24.7 (*)    All other components within normal limits  CBG MONITORING, ED - Abnormal; Notable for the following components:   Glucose-Capillary 58 (*)    All other components within normal limits  CBG MONITORING, ED  TROPONIN I (HIGH SENSITIVITY)  TROPONIN I (HIGH SENSITIVITY)    EKG EKG Interpretation  Date/Time:  Monday September 16 2019 19:35:19 EST Ventricular Rate:  86 PR Interval:    QRS Duration: 109 QT Interval:  399 QTC Calculation: 478 R Axis:   128 Text Interpretation: Sinus rhythm Biatrial enlargement Right axis deviation Consider left ventricular hypertrophy Anterior Q waves, possibly due to LVH Confirmed by Milton Ferguson [phone]) on 09/16/2019 8:30:16 PM   Radiology DG Chest 2 View  Result Date: 09/16/2019 CLINICAL DATA:  Arm pain EXAM: CHEST - 2 VIEW COMPARISON:  None. FINDINGS: The heart size and mediastinal contours are within normal limits. Both lungs are clear. The visualized skeletal structures are unremarkable. IMPRESSION: No active cardiopulmonary disease. Electronically Signed   By: Ulyses Jarred M.D.   On: 09/16/2019 20:45   CT Head Wo Contrast  Result Date: 09/16/2019 CLINICAL DATA:  54 year old male with ataxia. EXAM: CT HEAD WITHOUT CONTRAST TECHNIQUE: Contiguous axial images were obtained from the base of the skull through the vertex without intravenous contrast. COMPARISON:  Head CT dated 09/07/2019. FINDINGS: Brain: There is mild age-related atrophy and chronic microvascular ischemic changes. There is no acute intracranial hemorrhage. No mass effect or midline shift. No extra-axial fluid collection. Vascular: No hyperdense vessel or unexpected  calcification. Skull: Normal. Negative for fracture or focal lesion. Sinuses/Orbits: Mild mucoperiosteal thickening of paranasal sinuses. No air-fluid level. Mastoid air cells are clear. Other: None IMPRESSION: 1. No acute intracranial pathology. 2. Age-related atrophy and chronic microvascular ischemic changes. Electronically Signed   By: Anner Crete M.D.   On: 09/16/2019 21:34    Procedures Procedures (including critical care time)  Medications Ordered in ED Medications  dextrose 50 % solution 50 mL (25 mLs Intravenous Given 09/16/19 2149)    ED Course  I have reviewed the  triage vital signs and the nursing notes.  Pertinent labs & imaging results that were available during my care of the patient were reviewed by me and considered in my medical decision making (see chart for details).    MDM Rules/Calculators/A&P                      Cardiac enzymes are normal.  Patient has been hypoglycemic.  We will stop the Glucotrol and follow-up with his PCP Final Clinical Impression(s) / ED Diagnoses Final diagnoses:  Hypoglycemia    Rx / DC Orders ED Discharge Orders    None       Milton Ferguson, MD 09/17/19 2312

## 2019-09-18 DIAGNOSIS — I5022 Chronic systolic (congestive) heart failure: Secondary | ICD-10-CM | POA: Diagnosis not present

## 2019-09-18 DIAGNOSIS — M79602 Pain in left arm: Secondary | ICD-10-CM | POA: Diagnosis not present

## 2019-09-18 DIAGNOSIS — J17 Pneumonia in diseases classified elsewhere: Secondary | ICD-10-CM | POA: Diagnosis not present

## 2019-09-18 DIAGNOSIS — N3942 Incontinence without sensory awareness: Secondary | ICD-10-CM | POA: Diagnosis not present

## 2019-09-19 DIAGNOSIS — S42472A Displaced transcondylar fracture of left humerus, initial encounter for closed fracture: Secondary | ICD-10-CM | POA: Diagnosis not present

## 2019-10-15 DIAGNOSIS — F1027 Alcohol dependence with alcohol-induced persisting dementia: Secondary | ICD-10-CM | POA: Diagnosis not present

## 2019-10-15 DIAGNOSIS — I426 Alcoholic cardiomyopathy: Secondary | ICD-10-CM | POA: Diagnosis not present

## 2019-10-15 DIAGNOSIS — I5022 Chronic systolic (congestive) heart failure: Secondary | ICD-10-CM | POA: Diagnosis not present

## 2019-10-15 DIAGNOSIS — Z01810 Encounter for preprocedural cardiovascular examination: Secondary | ICD-10-CM | POA: Diagnosis not present

## 2019-10-15 DIAGNOSIS — I110 Hypertensive heart disease with heart failure: Secondary | ICD-10-CM | POA: Diagnosis not present

## 2019-11-21 ENCOUNTER — Encounter: Payer: Self-pay | Admitting: Neurology

## 2019-11-21 ENCOUNTER — Ambulatory Visit (INDEPENDENT_AMBULATORY_CARE_PROVIDER_SITE_OTHER): Payer: Medicare HMO | Admitting: Neurology

## 2019-11-21 ENCOUNTER — Other Ambulatory Visit: Payer: Self-pay

## 2019-11-21 VITALS — BP 132/77 | HR 72 | Temp 97.2°F | Ht 62.0 in | Wt 170.0 lb

## 2019-11-21 DIAGNOSIS — R269 Unspecified abnormalities of gait and mobility: Secondary | ICD-10-CM | POA: Insufficient documentation

## 2019-11-21 DIAGNOSIS — G3281 Cerebellar ataxia in diseases classified elsewhere: Secondary | ICD-10-CM

## 2019-11-21 DIAGNOSIS — R9089 Other abnormal findings on diagnostic imaging of central nervous system: Secondary | ICD-10-CM | POA: Diagnosis not present

## 2019-11-21 NOTE — Progress Notes (Signed)
PATIENT: Luis Riley DOB: 26-Dec-1965  Chief Complaint  Patient presents with  . Gait Problem    MMSE 17/30 - 4 animals. He is here with his wife, Luis Riley. They have the following concerns: unsteady gait, confusion, forgetfulness, memory loss. Reports having TIA events in the past. He recently had CT head.   Marland Kitchen PCP    Neale Burly, MD  . Orthopaedics    Case, Reche Dixon, MD - referring provider     HISTORICAL  Luis Riley is a 54 year old male, seen in request by orthopedic surgeon Dr. Case, Remo Lipps and his primary care physician Dr. Stoney Bang for evaluation of gait abnormality, increased confusion, he is accompanied by his wife of 102 years Luis Riley at today's visit on November 21, 2019.  I have reviewed and summarized the referring note from the referring physician.  He has past medical history of hypertension, hyperlipidemia, diabetes, used to work at Architect work, injured Teacher, English as a foreign language, has remained unemployed since 2016.  Right eye blindness since young.  He was noted to have rapid worsening since fall 2020, was noted that he has increased confusion, could not carry on a conversation, increased gait abnormality, legs become wobbly, stuttering while talking, get confused, have hallucinations,  He fell on September 16 2019, when he was noted by his wife that he did not wake up at his usual time, when she check on him, he was noted he was sweaty, confused, glucose was in 60s, some improvement with orange juice, while unattended, he got up confused, fell, broke his left elbow, I reviewed note from [facility] medicine [facility] Dr. Case, traumatic closed displaced transcondylar fracture of distal humerus on the left side, he was put on left splint, in need for operative internal fixation of his fracture, he is sent here for presurgical clearance.  I personally reviewed CT head without contrast on September 16, 2019, no acute abnormality, advanced atrophy, supratentorium  small vessel disease  Laboratory evaluation in 2021, negative troponin,  CBC, mild anemia hemoglobin of 11.7, BMP, creatinine of 2.67,  REVIEW OF SYSTEMS: Full 14 system review of systems performed and notable only for as above All other review of systems were negative.  ALLERGIES: No Known Allergies  HOME MEDICATIONS: Current Outpatient Medications  Medication Sig Dispense Refill  . amLODipine (NORVASC) 5 MG tablet Take 5 mg by mouth daily.    Marland Kitchen atorvastatin (LIPITOR) 20 MG tablet Take 20 mg by mouth every morning.     . furosemide (LASIX) 20 MG tablet Take 20 mg by mouth 2 (two) times daily.     Marland Kitchen gabapentin (NEURONTIN) 300 MG capsule Take 300 mg by mouth 3 (three) times daily as needed (for nerve pain).     Marland Kitchen glipiZIDE (GLUCOTROL) 5 MG tablet Take 5 mg by mouth 2 (two) times daily.    Marland Kitchen lisinopril (ZESTRIL) 20 MG tablet Take 20 mg by mouth daily.     No current facility-administered medications for this visit.    PAST MEDICAL HISTORY: Past Medical History:  Diagnosis Date  . Cervicalgia   . Confusion   . COPD (chronic obstructive pulmonary disease) ([place])   . Diabetes mellitus without complication ([place])   . Diabetic retinopathy of left eye with macular edema associated with diabetes mellitus due to underlying condition ([place])   . Dyslipidemia   . Hypertension   . Radiculopathy of cervical region     PAST SURGICAL HISTORY: Past Surgical History:  Procedure Laterality Date  .  CHOLECYSTECTOMY      FAMILY HISTORY: Family History  Problem Relation Age of Onset  . Diabetes Mother   . Suicidality Father     SOCIAL HISTORY: Social History   Socioeconomic History  . Marital status: Married    Spouse name: Not on file  . Number of children: 4  . Years of education: 11th grade  . Highest education level: Not on file  Occupational History  . Occupation: Disabled  Tobacco Use  . Smoking status: Current Some Day Smoker    Types: Cigarettes  . Smokeless tobacco: Never  Used  . Tobacco comment: "cigarette every now and then"  Substance and Sexual Activity  . Alcohol use: Not Currently    Comment: previously heavy drinker - "not in a couple of year"  . Drug use: Not Currently    Comment: previously used marijuana - "not since 06/2019"  . Sexual activity: Not on file  Other Topics Concern  . Not on file  Social History Narrative   Lives with his wife.   Right-handed.   Caffeine use: 2 cups coffee, 3 cups soda per day.   Social Determinants of Health   Financial Resource Strain:   . Difficulty of Paying Living Expenses:   Food Insecurity:   . Worried About Charity fundraiser in the Last Year:   . Arboriculturist in the Last Year:   Transportation Needs:   . Film/video editor (Medical):   Marland Kitchen Lack of Transportation (Non-Medical):   Physical Activity:   . Days of Exercise per Week:   . Minutes of Exercise per Session:   Stress:   . Feeling of Stress :   Social Connections:   . Frequency of Communication with Friends and Family:   . Frequency of Social Gatherings with Friends and Family:   . Attends Religious Services:   . Active Member of Clubs or Organizations:   . Attends Archivist Meetings:   Marland Kitchen Marital Status:   Intimate Partner Violence:   . Fear of Current or Ex-Partner:   . Emotionally Abused:   Marland Kitchen Physically Abused:   . Sexually Abused:      PHYSICAL EXAM   Vitals:   11/21/19 1407  BP: 132/77  Pulse: 72  Temp: (!) 97.2 F (36.2 C)  Weight: 170 lb (77.1 kg)  Height: 5\' 2"  (1.575 m)    Not recorded      Body mass index is 31.09 kg/m.  PHYSICAL EXAMNIATION:  Gen: NAD, conversant, well nourised, well groomed                     Cardiovascular: Regular rate rhythm, no peripheral edema, warm, nontender. Eyes: Conjunctivae clear without exudates or hemorrhage Neck: Supple, no carotid bruits. Pulmonary: Clear to auscultation bilaterally   NEUROLOGICAL EXAM:  MENTAL STATUS: Speech/cognition He is  confused, not oriented to his age, difficulty came up with his children's name, not oriented to the year and month.   CRANIAL NERVES: CN II: Left pupils were round reactive to light, right is oblique CN III, IV, VI: Left extraocular movement was normal CN V: Facial sensation is intact to light touch CN VII: Face is symmetric with normal eye closure  CN VIII: Hearing is normal to causal conversation. CN IX, X: Phonation is normal. CN XI: Head turning and shoulder shrug are intact  MOTOR: Left arm in splint, using right arm without difficulty, there was no significant bilateral lower extremity proximal and distal  weakness  REFLEXES: Reflexes are 1 and symmetric at the biceps, triceps, knees, and absent ankles. Plantar responses are flexor.  SENSORY: Not reliable, seems to have length dependent decreased light touch pinprick, vibratory sensation.  COORDINATION: There is no trunk or limb dysmetria noted.  GAIT/STANCE: He needs push-up to get up from seated position, impulsive, unsteady  DIAGNOSTIC DATA (LABS, IMAGING, TESTING) - I reviewed patient records, labs, notes, testing and imaging myself where available.   ASSESSMENT AND PLAN  Deja Kaigler is a 54 y.o. male   Progressive worsening confusion, gait abnormality,  Abnormal CT head without contrast  He does has multiple vascular risk factor of hypertension, hyperlipidemia, diabetes  MRI of brain, ultrasound of carotid artery, echocardiogram.  I also advised him to take baby aspirin daily  Traumatic closed displaced transcondylar fracture of left distal humerus  Elective surgery is pending by orthopedic surgeon Dr. Case, need presurgical clearance.  Marcial Pacas, M.D. Ph.D.  [facility] [address], [place], [place] [address] Ph: [phone] Fax: [phone]  CC: Case, Reche Dixon, MD, Neale Burly, MD

## 2019-11-25 ENCOUNTER — Telehealth: Payer: Self-pay | Admitting: Neurology

## 2019-11-25 NOTE — Telephone Encounter (Signed)
Mcarthur Rossetti Josem Kaufmann: [phone] (exp. 11/25/19 to 12/25/19) order sent to GI. They will reach out to the patient to schedule.

## 2019-11-29 ENCOUNTER — Ambulatory Visit (HOSPITAL_COMMUNITY): Payer: Medicare HMO

## 2019-12-10 ENCOUNTER — Ambulatory Visit (HOSPITAL_BASED_OUTPATIENT_CLINIC_OR_DEPARTMENT_OTHER)
Admission: RE | Admit: 2019-12-10 | Discharge: 2019-12-10 | Disposition: A | Discharge status: Home / Self Care | Payer: Medicare HMO | Source: Ambulatory Visit | Attending: Neurology | Admitting: Neurology

## 2019-12-10 ENCOUNTER — Other Ambulatory Visit: Payer: Self-pay

## 2019-12-10 ENCOUNTER — Ambulatory Visit (HOSPITAL_COMMUNITY)
Admission: RE | Admit: 2019-12-10 | Discharge: 2019-12-10 | Disposition: A | Discharge status: Home / Self Care | Payer: Medicare HMO | Source: Ambulatory Visit | Attending: Neurology | Admitting: Neurology

## 2019-12-10 ENCOUNTER — Telehealth: Payer: Self-pay | Admitting: Neurology

## 2019-12-10 DIAGNOSIS — G3281 Cerebellar ataxia in diseases classified elsewhere: Secondary | ICD-10-CM

## 2019-12-10 DIAGNOSIS — I119 Hypertensive heart disease without heart failure: Secondary | ICD-10-CM | POA: Diagnosis not present

## 2019-12-10 DIAGNOSIS — E119 Type 2 diabetes mellitus without complications: Secondary | ICD-10-CM | POA: Diagnosis not present

## 2019-12-10 DIAGNOSIS — R269 Unspecified abnormalities of gait and mobility: Secondary | ICD-10-CM

## 2019-12-10 DIAGNOSIS — I6523 Occlusion and stenosis of bilateral carotid arteries: Secondary | ICD-10-CM | POA: Diagnosis not present

## 2019-12-10 DIAGNOSIS — R9089 Other abnormal findings on diagnostic imaging of central nervous system: Secondary | ICD-10-CM

## 2019-12-10 DIAGNOSIS — F17200 Nicotine dependence, unspecified, uncomplicated: Secondary | ICD-10-CM | POA: Diagnosis not present

## 2019-12-10 DIAGNOSIS — J449 Chronic obstructive pulmonary disease, unspecified: Secondary | ICD-10-CM | POA: Insufficient documentation

## 2019-12-10 DIAGNOSIS — E785 Hyperlipidemia, unspecified: Secondary | ICD-10-CM | POA: Insufficient documentation

## 2019-12-10 DIAGNOSIS — I5189 Other ill-defined heart diseases: Secondary | ICD-10-CM

## 2019-12-10 DIAGNOSIS — I509 Heart failure, unspecified: Secondary | ICD-10-CM | POA: Insufficient documentation

## 2019-12-10 NOTE — Progress Notes (Signed)
Luis Riley:  Ultrasound of carotid arteries showed less than 39% stenosis of bilateral internal carotid artery, anterograde flow of bilateral vertebral artery.  There is no change of treatment plan.  Marcial Pacas, M.D. Ph.D.  [facility] [place], [place] [address] Phone: [phone] Fax:      [phone]

## 2019-12-10 NOTE — Telephone Encounter (Addendum)
1. Severe global reduction in LV systolic function; mild LVE; grade 2 diastolic dysfunction.  2. Left ventricular ejection fraction, by estimation, is 25 to 30%. The left ventricle has severely decreased function. The left ventricle demonstrates global hypokinesis. The left ventricular internal cavity size was mildly dilated. Left ventricular  diastolic parameters are consistent with Grade II diastolic dysfunction (pseudonormalization). Elevated left atrial pressure.  3. Right ventricular systolic function is normal. The right ventricular size is normal.  4. The mitral valve is normal in structure. Trivial mitral valve regurgitation. No evidence of mitral stenosis.  5. The aortic valve is tricuspid. Aortic valve regurgitation is not visualized. No aortic stenosis is present.  6. The inferior vena cava is normal in size with greater than 50% respiratory variability, suggesting right atrial pressure of 3 mmHg.    I have called patient's wife, explained echocardiogram findings, severely decreased ejection fraction 25 to 30%, global hypokinesia, I have put in order for cardiology referral, close to [facility] system  Ultrasound of carotid arteries showed less than 39% stenosis bilaterally, anterograde flow of bilateral vertebral artery  Start him on aspirin 81 mg daily

## 2019-12-10 NOTE — Progress Notes (Signed)
Carotid artery duplex completed. Refer to "CV Proc" under chart review to view preliminary results.  12/10/2019 1:35 PM Kelby Aline., MHA, RVT, RDCS, RDMS

## 2019-12-10 NOTE — Progress Notes (Signed)
  Echocardiogram 2D Echocardiogram has been performed.  Randa Lynn Jenasis Straley 12/10/2019, 2:47 PM

## 2019-12-12 DIAGNOSIS — N3942 Incontinence without sensory awareness: Secondary | ICD-10-CM | POA: Diagnosis not present

## 2019-12-12 DIAGNOSIS — I5022 Chronic systolic (congestive) heart failure: Secondary | ICD-10-CM | POA: Diagnosis not present

## 2019-12-12 DIAGNOSIS — Z6829 Body mass index (BMI) 29.0-29.9, adult: Secondary | ICD-10-CM | POA: Diagnosis not present

## 2019-12-12 DIAGNOSIS — F0151 Vascular dementia with behavioral disturbance: Secondary | ICD-10-CM | POA: Diagnosis not present

## 2019-12-12 DIAGNOSIS — E785 Hyperlipidemia, unspecified: Secondary | ICD-10-CM | POA: Diagnosis not present

## 2019-12-12 DIAGNOSIS — M79602 Pain in left arm: Secondary | ICD-10-CM | POA: Diagnosis not present

## 2019-12-12 DIAGNOSIS — E119 Type 2 diabetes mellitus without complications: Secondary | ICD-10-CM | POA: Diagnosis not present

## 2019-12-19 NOTE — Progress Notes (Signed)
PATIENT: Luis Riley DOB: 04-08-1966  REASON FOR VISIT: follow up HISTORY FROM: patient  HISTORY OF PRESENT ILLNESS: Today 12/23/19  HISTORY Luis Riley is a 54 year old male, seen in request by orthopedic surgeon Dr. Jaclynn Guarneri, Remo Lipps and his primary care physician Dr. Stoney Bang for evaluation of gait abnormality, increased confusion, he is accompanied by his wife of 15 years Luis Riley at today's visit on November 21, 2019.  I have reviewed and summarized the referring note from the referring physician.  He has past medical history of hypertension, hyperlipidemia, diabetes, used to work at Architect work, injured Teacher, English as a foreign language, has remained unemployed since 2016.  Right eye blindness since young.  He was noted to have rapid worsening since fall 2020, was noted that he has increased confusion, could not carry on a conversation, increased gait abnormality, legs become wobbly, stuttering while talking, get confused, have hallucinations,  He fell on September 16 2019, when he was noted by his wife that he did not wake up at his usual time, when she check on him, he was noted he was sweaty, confused, glucose was in 60s, some improvement with orange juice, while unattended, he got up confused, fell, broke his left elbow, I reviewed note from [place] and sports medicine at [facility] Dr. Case, traumatic closed displaced transcondylar fracture of distal humerus on the left side, he was put on left splint, in need for operative internal fixation of his fracture, he is sent here for presurgical clearance.  I personally reviewed CT head without contrast on September 16, 2019, no acute abnormality, advanced atrophy, supratentorium small vessel disease  Laboratory evaluation in 2021, negative troponin,  CBC, mild anemia hemoglobin of 11.7, BMP, creatinine of 2.67,  Update Dec 23, 2019 SS: When last seen, MRI of the brain, US carotid arteries, echocardiogram was ordered. Echo showed significant  decreased EF 25-30%, global hypokinesia, was referred to cardiology.  Carotid US showed less than 39% stenosis of bilateral internal carotid arteries, anterograde flow of bilateral vertebral artery.  MRI of the brain has yet to be completed.  He will be seeing cardiology May 25, his wife indicates things are better, he is relearning tasks, speech is better, still most difficulty with ambulating, walking is unsteady, forward leaning.  He remains on aspirin 81 mg daily.  His wife has been working with him, he can use cell phone, they walk daily. Still wearing sling to left arm, no recent falls.  Presents today for follow-up accompanied by his wife, Luis Riley. Today, he is tired, just got up. With questioning of birthday date, gave the wrong date. Speech fairly clear, has to prompt for verbal response, history from his wife.  REVIEW OF SYSTEMS: Out of a complete 14 system review of symptoms, the patient complains only of the following symptoms, and all other reviewed systems are negative.  Walking difficulty  ALLERGIES: No Known Allergies  HOME MEDICATIONS: Outpatient Medications Prior to Visit  Medication Sig Dispense Refill  . amLODipine (NORVASC) 5 MG tablet Take 5 mg by mouth daily.    Marland Kitchen aspirin 81 MG chewable tablet Chew by mouth daily.    Marland Kitchen atorvastatin (LIPITOR) 20 MG tablet Take 20 mg by mouth every morning.     . gabapentin (NEURONTIN) 300 MG capsule Take 300 mg by mouth 3 (three) times daily as needed (for nerve pain).     Marland Kitchen glipiZIDE (GLUCOTROL) 5 MG tablet Take 5 mg by mouth 2 (two) times daily.    Marland Kitchen lisinopril (ZESTRIL) 20 MG  tablet Take 20 mg by mouth daily.    . furosemide (LASIX) 20 MG tablet Take 20 mg by mouth 2 (two) times daily.      No facility-administered medications prior to visit.    PAST MEDICAL HISTORY: Past Medical History:  Diagnosis Date  . Cervicalgia   . Confusion   . COPD (chronic obstructive pulmonary disease) ([place])   . Diabetes mellitus without  complication ([place])   . Diabetic retinopathy of left eye with macular edema associated with diabetes mellitus due to underlying condition ([place])   . Dyslipidemia   . Hypertension   . Radiculopathy of cervical region     PAST SURGICAL HISTORY: Past Surgical History:  Procedure Laterality Date  . CHOLECYSTECTOMY      FAMILY HISTORY: Family History  Problem Relation Age of Onset  . Diabetes Mother   . Suicidality Father     SOCIAL HISTORY: Social History   Socioeconomic History  . Marital status: Married    Spouse name: Not on file  . Number of children: 4  . Years of education: 11th grade  . Highest education level: Not on file  Occupational History  . Occupation: Disabled  Tobacco Use  . Smoking status: Current Some Day Smoker    Types: Cigarettes  . Smokeless tobacco: Never Used  . Tobacco comment: "cigarette every now and then"  Substance and Sexual Activity  . Alcohol use: Not Currently    Comment: previously heavy drinker - "not in a couple of year"  . Drug use: Not Currently    Comment: previously used marijuana - "not since 06/2019"  . Sexual activity: Not on file  Other Topics Concern  . Not on file  Social History Narrative   Lives with his wife.   Right-handed.   Caffeine use: 2 cups coffee, 3 cups soda per day.   Social Determinants of Health   Financial Resource Strain:   . Difficulty of Paying Living Expenses:   Food Insecurity:   . Worried About Charity fundraiser in the Last Year:   . Arboriculturist in the Last Year:   Transportation Needs:   . Film/video editor (Medical):   Marland Kitchen Lack of Transportation (Non-Medical):   Physical Activity:   . Days of Exercise per Week:   . Minutes of Exercise per Session:   Stress:   . Feeling of Stress :   Social Connections:   . Frequency of Communication with Friends and Family:   . Frequency of Social Gatherings with Friends and Family:   . Attends Religious Services:   . Active Member of Clubs or  Organizations:   . Attends Archivist Meetings:   Marland Kitchen Marital Status:   Intimate Partner Violence:   . Fear of Current or Ex-Partner:   . Emotionally Abused:   Marland Kitchen Physically Abused:   . Sexually Abused:       PHYSICAL EXAM  Vitals:   12/23/19 1313  BP: 127/75  Pulse: 87  Temp: (!) 96.8 F (36 C)  Weight: 173 lb (78.5 kg)   Body mass index is 31.64 kg/m.  Generalized: Well developed, in no acute distress  MMSE - Mini Mental State Exam 11/21/2019  Orientation to time 1  Orientation to Place 3  Registration 3  Attention/ Calculation 0  Recall 2  Language- name 2 objects 2  Language- repeat 1  Language- follow 3 step command 3  Language- read & follow direction 1  Write a sentence 1  Copy design 0  Total score 17    Neurological examination  Mentation: Alert, oriented to self, gave wrong birth date situation, follows exam commands well, speech mildly slurred Cranial nerve II-XII: Right pupil lag to the right blindness, left pupil is round reactive to light, normal extraocular movement on the left. Facial sensation and strength were normal.  Head turning and shoulder shrug  were normal and symmetric. Motor: Left arm is in splint, uses right arm without difficulty, equal grip strength, no significant lower extremity weakness noted Sensory: Sensory testing is intact to soft touch on all 4 extremities. No evidence of extinction is noted.  Coordination: Cerebellar testing reveals good finger-nose-finger on the right, heel-to-shin bilaterally Gait and station: Has to push off from seated position, jumpy, dragging the right foot, unsteady Reflexes: Deep tendon reflexes are symmetric but depressed bilaterally  DIAGNOSTIC DATA (LABS, IMAGING, TESTING) - I reviewed patient records, labs, notes, testing and imaging myself where available.  Lab Results  Component Value Date   WBC 6.4 09/16/2019   HGB 11.7 (L) 09/16/2019   HCT 38.8 (L) 09/16/2019   MCV 81.9 09/16/2019     PLT 284 09/16/2019      Component Value Date/Time   NA 135 09/16/2019 1948   K 4.1 09/16/2019 1948   CL 99 09/16/2019 1948   CO2 27 09/16/2019 1948   GLUCOSE 58 (L) 09/16/2019 1948   BUN 53 (H) 09/16/2019 1948   CREATININE 2.67 (H) 09/16/2019 1948   CALCIUM 9.7 09/16/2019 1948   GFRNONAA 26 (L) 09/16/2019 1948   GFRAA 31 (L) 09/16/2019 1948   No results found for: CHOL, HDL, LDLCALC, LDLDIRECT, TRIG, CHOLHDL No results found for: HGBA1C No results found for: VITAMINB12 No results found for: TSH    ASSESSMENT AND PLAN 54 y.o. year old male  has a past medical history of Cervicalgia, Confusion, COPD (chronic obstructive pulmonary disease) ([place]), Diabetes mellitus without complication ([place]), Diabetic retinopathy of left eye with macular edema associated with diabetes mellitus due to underlying condition ([place]), Dyslipidemia, Hypertension, and Radiculopathy of cervical region. here with:  1.  Progressive worsening confusion, gait abnormality -Since last seen, his wife feels his confusion, speech have improved, continues to have difficulty ambulating -Abnormal CT head without contrast, MMSE 17/30 -Multiple vascular risk factors of hypertension, hyperlipidemia, diabetes -Continue aspirin 81 mg daily -Echocardiogram showed severely decreased EF 25 to 30%, was referred to cardiology (seeing May 25) -Carotid US showed left and 39% stenosis of bilateral internal carotid arteries, anterograde flow of bilateral vertebral arteries -MRI of the brain has yet to be completed, I will get this set up, given phone # to contact [facility] -Follow-up in 2 months to discuss results of MRI, evaluate symptoms  2.  Traumatic closed displaced transcondylar fracture of left distal humerus -Originally came to see [place] for presurgical clearance, no surgery as of yet  I spent 30 minutes of face-to-face and non-face-to-face time with patient. This included previsit chart review, lab review, study  review, order entry, electronic health record documentation, patient education.  Butler Denmark, AGNP-C, DNP 12/23/2019, 2:25 PM [facility] [address] [place], [place] [address] [phone]

## 2019-12-23 ENCOUNTER — Encounter: Payer: Self-pay | Admitting: Neurology

## 2019-12-23 ENCOUNTER — Other Ambulatory Visit: Payer: Self-pay

## 2019-12-23 ENCOUNTER — Ambulatory Visit (INDEPENDENT_AMBULATORY_CARE_PROVIDER_SITE_OTHER): Payer: Medicare HMO | Admitting: Neurology

## 2019-12-23 VITALS — BP 127/75 | HR 87 | Temp 96.8°F | Wt 173.0 lb

## 2019-12-23 DIAGNOSIS — R410 Disorientation, unspecified: Secondary | ICD-10-CM | POA: Diagnosis not present

## 2019-12-23 DIAGNOSIS — R269 Unspecified abnormalities of gait and mobility: Secondary | ICD-10-CM | POA: Diagnosis not present

## 2019-12-23 DIAGNOSIS — R9089 Other abnormal findings on diagnostic imaging of central nervous system: Secondary | ICD-10-CM | POA: Diagnosis not present

## 2019-12-23 NOTE — Progress Notes (Signed)
I have reviewed and agreed above plan. 

## 2019-12-23 NOTE — Patient Instructions (Signed)
It was nice to meet you today I will get MRI of the brain set up  Please keep appointment with Cardiology See you back in 2 months

## 2019-12-31 ENCOUNTER — Ambulatory Visit (INDEPENDENT_AMBULATORY_CARE_PROVIDER_SITE_OTHER): Payer: Medicare HMO | Admitting: Cardiovascular Disease

## 2019-12-31 ENCOUNTER — Encounter: Payer: Self-pay | Admitting: Cardiovascular Disease

## 2019-12-31 ENCOUNTER — Other Ambulatory Visit: Payer: Self-pay

## 2019-12-31 VITALS — BP 128/70 | HR 72 | Ht 64.0 in | Wt 176.0 lb

## 2019-12-31 DIAGNOSIS — E119 Type 2 diabetes mellitus without complications: Secondary | ICD-10-CM | POA: Diagnosis not present

## 2019-12-31 DIAGNOSIS — Z720 Tobacco use: Secondary | ICD-10-CM

## 2019-12-31 DIAGNOSIS — Z8673 Personal history of transient ischemic attack (TIA), and cerebral infarction without residual deficits: Secondary | ICD-10-CM | POA: Diagnosis not present

## 2019-12-31 DIAGNOSIS — I5042 Chronic combined systolic (congestive) and diastolic (congestive) heart failure: Secondary | ICD-10-CM

## 2019-12-31 DIAGNOSIS — J449 Chronic obstructive pulmonary disease, unspecified: Secondary | ICD-10-CM | POA: Diagnosis not present

## 2019-12-31 DIAGNOSIS — I10 Essential (primary) hypertension: Secondary | ICD-10-CM

## 2019-12-31 MED ORDER — METOPROLOL SUCCINATE ER 25 MG PO TB24: 12.5000 mg | ORAL_TABLET | Freq: Every day | ORAL | 3 refills | Status: DC

## 2019-12-31 MED ORDER — ENTRESTO 24-26 MG PO TABS: 1.0000 | ORAL_TABLET | Freq: Two times a day (BID) | ORAL | 6 refills | Status: DC

## 2019-12-31 MED ORDER — FUROSEMIDE 40 MG PO TABS: ORAL_TABLET | ORAL | 3 refills | Status: DC

## 2019-12-31 MED ORDER — POTASSIUM CHLORIDE CRYS ER 20 MEQ PO TBCR: EXTENDED_RELEASE_TABLET | ORAL | 3 refills | Status: DC

## 2019-12-31 NOTE — Progress Notes (Signed)
CARDIOLOGY CONSULT NOTE  Patient ID: Luis Riley MRN: [id number] DOB/AGE: [birth year] 54 y.o.  Admit date: (Not on file) Primary Physician: Neale Burly, MD  Reason for Consultation: Cardiomyopathy  HPI: Luis Riley is a 54 y.o. male who is being seen today for the evaluation of cardiomyopathy at the request of Marcial Pacas, MD.   I reviewed notes by his neurologist.  Past medical history includes cervicalgia, confusion, COPD, diabetes mellitus, diabetic retinopathy, chronic kidney disease stage III/IV, dyslipidemia, and hypertension.  He has had worsening confusion and gait abnormality.  He underwent an echocardiogram on 12/10/2019 which demonstrated severely reduced LV systolic function, EF 25 to 30%, global hypokinesis, mild LV dilatation, and grade 2 diastolic dysfunction with elevated left atrial filling pressures.  There was no significant valvular pathology.  Echocardiogram performed at [facility] on 08/26/2019 demonstrated LVEF 35% with mild mitral regurgitation and mild pulmonary hypertension, PASP 41 mmHg.  He was evaluated for chest discomfort in the ED on 09/16/2019.  I personally viewed all relevant documentation, labs, and studies.  Chest x-ray on 09/16/2019 showed no active cardiopulmonary disease.  Troponins were normal on 09/16/2019.  Creatinine was 2.67, BUN 53.  Hemoglobin 11.7.  Carotid Dopplers on 12/10/2019 showed 1 to 39% bilateral ICA stenosis.  I personally reviewed ECG performed on 09/16/2019 which demonstrated sinus rhythm with poor R wave progression.  There was a nonspecific IVCD.  He is here with his wife.  He was apparently hospitalized at [facility] in December 2020 and then hospitalized again in January.  She told me that he had pneumonia.  He may have also had a stroke at that time.  I do not have any records from these hospitalizations.  I reviewed the head CT performed on 09/16/2019 which showed no acute intracranial pathology and  age-related atrophy and chronic microvascular ischemic changes.  An MRI has been ordered and is pending.  He did see a cardiologist in [place] with [facility] in March 2021.  Patient has chest pain and shortness of breath when taking walks with his wife.  He denies leg swelling, orthopnea, palpitations, and paroxysmal nocturnal dyspnea.        No Known Allergies  Current Outpatient Medications  Medication Sig Dispense Refill  . amLODipine (NORVASC) 5 MG tablet Take 5 mg by mouth daily.    Marland Kitchen aspirin 81 MG chewable tablet Chew by mouth daily.    Marland Kitchen atorvastatin (LIPITOR) 20 MG tablet Take 20 mg by mouth every morning.     . dapagliflozin propanediol (FARXIGA) 5 MG TABS tablet Take 5 mg by mouth daily.    Marland Kitchen glipiZIDE (GLUCOTROL) 5 MG tablet Take 5 mg by mouth daily before breakfast.     . lisinopril (ZESTRIL) 20 MG tablet Take 20 mg by mouth daily.    Marland Kitchen gabapentin (NEURONTIN) 300 MG capsule Take 300 mg by mouth 3 (three) times daily as needed (for nerve pain).      No current facility-administered medications for this visit.    Past Medical History:  Diagnosis Date  . Cervicalgia   . Confusion   . COPD (chronic obstructive pulmonary disease) ([place])   . Diabetes mellitus without complication ([place])   . Diabetic retinopathy of left eye with macular edema associated with diabetes mellitus due to underlying condition ([place])   . Dyslipidemia   . Hypertension   . Radiculopathy of cervical region     Past Surgical History:  Procedure Laterality Date  . CHOLECYSTECTOMY  Social History   Socioeconomic History  . Marital status: Married    Spouse name: Not on file  . Number of children: 4  . Years of education: 11th grade  . Highest education level: Not on file  Occupational History  . Occupation: Disabled  Tobacco Use  . Smoking status: Current Some Day Smoker    Types: Cigarettes  . Smokeless tobacco: Never Used  . Tobacco comment: "cigarette every now and then"  Substance and  Sexual Activity  . Alcohol use: Not Currently    Comment: previously heavy drinker - "not in a couple of year"  . Drug use: Not Currently    Comment: previously used marijuana - "not since 06/2019"  . Sexual activity: Not on file  Other Topics Concern  . Not on file  Social History Narrative   Lives with his wife.   Right-handed.   Caffeine use: 2 cups coffee, 3 cups soda per day.   Social Determinants of Health   Financial Resource Strain:   . Difficulty of Paying Living Expenses:   Food Insecurity:   . Worried About Charity fundraiser in the Last Year:   . Arboriculturist in the Last Year:   Transportation Needs:   . Film/video editor (Medical):   Marland Kitchen Lack of Transportation (Non-Medical):   Physical Activity:   . Days of Exercise per Week:   . Minutes of Exercise per Session:   Stress:   . Feeling of Stress :   Social Connections:   . Frequency of Communication with Friends and Family:   . Frequency of Social Gatherings with Friends and Family:   . Attends Religious Services:   . Active Member of Clubs or Organizations:   . Attends Archivist Meetings:   Marland Kitchen Marital Status:   Intimate Partner Violence:   . Fear of Current or Ex-Partner:   . Emotionally Abused:   Marland Kitchen Physically Abused:   . Sexually Abused:      No family history of premature CAD in 1st degree relatives.  Current Meds  Medication Sig  . amLODipine (NORVASC) 5 MG tablet Take 5 mg by mouth daily.  Marland Kitchen aspirin 81 MG chewable tablet Chew by mouth daily.  Marland Kitchen atorvastatin (LIPITOR) 20 MG tablet Take 20 mg by mouth every morning.   . dapagliflozin propanediol (FARXIGA) 5 MG TABS tablet Take 5 mg by mouth daily.  Marland Kitchen glipiZIDE (GLUCOTROL) 5 MG tablet Take 5 mg by mouth daily before breakfast.   . lisinopril (ZESTRIL) 20 MG tablet Take 20 mg by mouth daily.      Review of systems complete and found to be negative unless listed above in HPI    Physical exam Blood pressure 128/70, pulse 72,  height 5\' 4"  (1.626 m), weight 176 lb (79.8 kg), SpO2 99 %. General: NAD Neck: No JVD, no thyromegaly or thyroid nodule.  Lungs: Prolonged expiratory phase with expiratory wheezes bilaterally and poor air movement overall. CV: Nondisplaced PMI. Regular rate and rhythm, normal S1/S2, no S3/S4, no murmur.  No peripheral edema.  No carotid bruit.    Abdomen: Soft, nontender, no distention.  Skin: Intact without lesions or rashes.  Neurologic: Alert and oriented.  Slurred speech. Psych: Normal affect. Extremities: No clubbing or cyanosis.  HEENT: Normal.   ECG: Most recent ECG reviewed.   Labs: Lab Results  Component Value Date/Time   K 4.1 09/16/2019 07:48 PM   BUN 53 (H) 09/16/2019 07:48 PM   CREATININE 2.67 (H)  09/16/2019 07:48 PM   HGB 11.7 (L) 09/16/2019 07:48 PM     Lipids: No results found for: LDLCALC, LDLDIRECT, CHOL, TRIG, HDL      ASSESSMENT AND PLAN:   1.  Chronic combined heart failure/chest pain or shortness of breath: LVEF 25 to 30% by echocardiogram on 12/10/2019 with grade 2 diastolic dysfunction.  Given his expiratory wheezes, I will obtain a dobutamine Myoview to rule out an ischemic etiology.  Currently on aspirin, atorvastatin, lisinopril, and [place].  I will start Toprol-XL 12.5 mg daily.  I will await a 36-hour washout of lisinopril and start Entresto 24-26 mg twice daily.  I have asked him to weigh daily.  I will prescribe Lasix 40 mg to be taken as needed for weight gain of 3 pounds in 24 hours.  I will also prescribe supplemental potassium to be taken with Lasix.  I will stop amlodipine.  2.  Hypertension: BP will need further monitoring given medication adjustments noted above.  3.  Type 2 diabetes mellitus: Currently on glipizide and Farxiga.  4.  COPD: Appears to be stable.  5.  Tobacco use: Between he and his wife they smoke a pack of cigarettes daily.  6.  Possible history of CVA: I will obtain records from [facility] for review.  Currently on  aspirin and statin.    Disposition: Follow up in 3 months  Signed: Kate Sable, M.D., F.A.C.C.  12/31/2019, 1:14 PM

## 2019-12-31 NOTE — Patient Instructions (Signed)
Medication Instructions:  STOP Amlodipine  STOP Lisinopril   36 hours after stopping Lisinopril , START Entresto 24/26 mg twice a day   START Toprol XL 12.5 mg daily   Take Lasix 40 mg daily for weight gain over 3 pounds in 24 hours  Take Potassium 20 meq daily ONLY on days you use Lasix       *If you need a refill on your cardiac medications before your next appointment, please call your pharmacy*   Lab Work: None today If you have labs (blood work) drawn today and your tests are completely normal, you will receive your results only by: Marland Kitchen MyChart Message (if you have MyChart) OR . A paper copy in the mail If you have any lab test that is abnormal or we need to change your treatment, we will call you to review the results.   Testing/Procedures:Your physician has requested that you have a dobutamine myoview. For furth information please visit [email or web address]. Please follow instruction sheet, as given.    Follow-Up: At [facility], you and your health needs are our priority.  As part of our continuing mission to provide you with exceptional heart care, we have created designated Provider Care Teams.  These Care Teams include your primary Cardiologist (physician) and Advanced Practice Providers (APPs -  Physician Assistants and Nurse Practitioners) who all work together to provide you with the care you need, when you need it.  We recommend signing up for the patient portal called "MyChart".  Sign up information is provided on this After Visit Summary.  MyChart is used to connect with patients for Virtual Visits (Telemedicine).  Patients are able to view lab/test results, encounter notes, upcoming appointments, etc.  Non-urgent messages can be sent to your provider as well.   To learn more about what you can do with MyChart, go to [email or web address].    Your next appointment:   3 month(s)  The format for your next appointment:   In Person  Provider:     Dr.Koneswaran   Other Instructions Weigh self daily       Thank you for choosing [facility] !

## 2020-01-02 ENCOUNTER — Ambulatory Visit: Payer: Medicare HMO | Admitting: Neurology

## 2020-01-10 ENCOUNTER — Telehealth: Payer: Self-pay

## 2020-01-10 NOTE — Telephone Encounter (Signed)
[employer] approved Entresto 24/26 mg BID through 08/07/2020

## 2020-01-23 ENCOUNTER — Ambulatory Visit
Admission: RE | Admit: 2020-01-23 | Discharge: 2020-01-23 | Disposition: A | Discharge status: Home / Self Care | Payer: Medicare HMO | Source: Ambulatory Visit | Attending: Neurology | Admitting: Neurology

## 2020-01-23 ENCOUNTER — Encounter (HOSPITAL_COMMUNITY): Payer: Medicare HMO

## 2020-01-23 ENCOUNTER — Other Ambulatory Visit: Payer: Self-pay

## 2020-01-23 DIAGNOSIS — R9089 Other abnormal findings on diagnostic imaging of central nervous system: Secondary | ICD-10-CM | POA: Diagnosis not present

## 2020-01-23 DIAGNOSIS — G3281 Cerebellar ataxia in diseases classified elsewhere: Secondary | ICD-10-CM | POA: Diagnosis not present

## 2020-01-23 DIAGNOSIS — R930 Abnormal findings on diagnostic imaging of skull and head, not elsewhere classified: Secondary | ICD-10-CM | POA: Diagnosis not present

## 2020-01-23 DIAGNOSIS — R269 Unspecified abnormalities of gait and mobility: Secondary | ICD-10-CM | POA: Diagnosis not present

## 2020-01-23 DIAGNOSIS — R410 Disorientation, unspecified: Secondary | ICD-10-CM | POA: Diagnosis not present

## 2020-01-27 ENCOUNTER — Other Ambulatory Visit: Payer: Self-pay

## 2020-01-27 ENCOUNTER — Encounter (HOSPITAL_BASED_OUTPATIENT_CLINIC_OR_DEPARTMENT_OTHER)
Admission: RE | Admit: 2020-01-27 | Discharge: 2020-01-27 | Disposition: A | Discharge status: Home / Self Care | Payer: Medicare HMO | Source: Ambulatory Visit | Attending: Cardiovascular Disease | Admitting: Cardiovascular Disease

## 2020-01-27 ENCOUNTER — Other Ambulatory Visit (HOSPITAL_COMMUNITY): Payer: Medicare HMO

## 2020-01-27 ENCOUNTER — Encounter (HOSPITAL_COMMUNITY)
Admission: RE | Admit: 2020-01-27 | Discharge: 2020-01-27 | Disposition: A | Discharge status: Home / Self Care | Payer: Medicare HMO | Source: Ambulatory Visit | Attending: Cardiovascular Disease | Admitting: Cardiovascular Disease

## 2020-01-27 ENCOUNTER — Telehealth: Payer: Self-pay | Admitting: Neurology

## 2020-01-27 DIAGNOSIS — I5042 Chronic combined systolic (congestive) and diastolic (congestive) heart failure: Secondary | ICD-10-CM

## 2020-01-27 LAB — NM MYOCAR MULTI W/SPECT W/WALL MOTION / EF
LV dias vol: 117 mL (ref 62–150)
LV sys vol: 81 mL
MPHR: 169 {beats}/min
Peak HR: 144 {beats}/min
Percent HR: 85 %
RATE: 0.55
Rest HR: 74 {beats}/min
SDS: 0
SRS: 0
SSS: 0
TID: 0.87

## 2020-01-27 MED ORDER — ATROPINE SULFATE 1 MG/ML IJ SOLN
INTRAMUSCULAR | Status: AC
  Filled 2020-01-27: qty 1

## 2020-01-27 MED ORDER — TECHNETIUM TC 99M TETROFOSMIN IV KIT
30.0000 | PACK | Freq: Once | INTRAVENOUS | Status: AC | PRN
  Administered 2020-01-27: 30 via INTRAVENOUS

## 2020-01-27 MED ORDER — TECHNETIUM TC 99M TETROFOSMIN IV KIT
10.0000 | PACK | Freq: Once | INTRAVENOUS | Status: AC | PRN
  Administered 2020-01-27: 11 via INTRAVENOUS

## 2020-01-27 MED ORDER — DOBUTAMINE IN D5W 4-5 MG/ML-% IV SOLN
INTRAVENOUS | Status: AC
  Administered 2020-01-27: 1000 mg via INTRAVENOUS
  Filled 2020-01-27: qty 250

## 2020-01-27 NOTE — Telephone Encounter (Signed)
I spoke to the patient's wife and she accepted an appt on 01/28/20 to review his MRI findings with Dr. Krista Blue.

## 2020-01-27 NOTE — Telephone Encounter (Signed)
The brain parenchyma shows area of diffusion possibility in the right superior cerebellum adjacent to the fourth ventricle which is not dark on the ADC and suggests a subacute infarct.  Is also diffusion positive lesion in the left frontal periventricular region which is likely T2 shine through.  There are several chronic remote infarcts located in the pons, left thalamus, bilateral coronary radiata and left frontal periventricular region  Please call patient, MRI of the brain showed significant abnormality, extensive periventricular oval-shaped lesions, in addition there was positive DWI lesion involving right superior cerebellum  Please bring him in this week to review MRI, will discuss further evaluations

## 2020-01-30 ENCOUNTER — Other Ambulatory Visit: Payer: Self-pay

## 2020-01-30 ENCOUNTER — Ambulatory Visit (INDEPENDENT_AMBULATORY_CARE_PROVIDER_SITE_OTHER): Payer: Medicare HMO | Admitting: Neurology

## 2020-01-30 ENCOUNTER — Encounter: Payer: Self-pay | Admitting: Neurology

## 2020-01-30 ENCOUNTER — Ambulatory Visit: Payer: Medicare HMO | Admitting: Neurology

## 2020-01-30 VITALS — BP 122/70 | HR 77 | Ht 64.0 in | Wt 179.5 lb

## 2020-01-30 DIAGNOSIS — R930 Abnormal findings on diagnostic imaging of skull and head, not elsewhere classified: Secondary | ICD-10-CM | POA: Diagnosis not present

## 2020-01-30 DIAGNOSIS — G35 Multiple sclerosis: Secondary | ICD-10-CM | POA: Diagnosis not present

## 2020-01-30 DIAGNOSIS — Z7689 Persons encountering health services in other specified circumstances: Secondary | ICD-10-CM | POA: Diagnosis not present

## 2020-01-30 DIAGNOSIS — R269 Unspecified abnormalities of gait and mobility: Secondary | ICD-10-CM

## 2020-01-30 DIAGNOSIS — R410 Disorientation, unspecified: Secondary | ICD-10-CM

## 2020-01-30 MED ORDER — LAMOTRIGINE 100 MG PO TABS: 100.0000 mg | ORAL_TABLET | Freq: Two times a day (BID) | ORAL | 11 refills | Status: DC

## 2020-01-30 MED ORDER — LAMOTRIGINE 25 MG PO TABS
ORAL_TABLET | ORAL | 0 refills | Status: DC
Start: 2020-01-30 — End: 2020-04-01

## 2020-01-30 NOTE — Progress Notes (Signed)
HISTORY OF PRESENT ILLNESS:   Luis Riley is a 54 year old male, seen in request by orthopedic surgeon Dr. Case, Remo Lipps and his primary care physician Dr. Stoney Bang for evaluation of gait abnormality, increased confusion, he is accompanied by his wife of 57 years [name] at today's visit on November 21, 2019.  I have reviewed and summarized the referring note from the referring physician.  He has past medical history of hypertension, hyperlipidemia, diabetes, used to work at Architect work, injured Teacher, English as a foreign language, has remained unemployed since 2016.  Right eye blindness since young.  He was noted to have rapid worsening since fall 2020, was noted that he has increased confusion, could not carry on a conversation, increased gait abnormality, legs become wobbly, stuttering while talking, get confused, have hallucinations,  He fell on September 16 2019, when he was noted by his wife that he did not wake up at his usual time, when she check on him, he was sweaty, confused, glucose was in 60s, some improvement with orange juice, while unattended, he got up confused, fell, broke his left elbow, I reviewed note from [place] and sports medicine at [facility] Dr. Case, traumatic closed displaced transcondylar fracture of distal humerus on the left side, he was put on left splint, in need for operative internal fixation of his fracture, he is sent here for presurgical clearance.  CT head without contrast on September 16, 2019, no acute abnormality, advanced atrophy, supratentorium small vessel disease  Laboratory evaluation in 2021, negative troponin,  CBC, mild anemia hemoglobin of 11.7, BMP, creatinine of 2.67,  UPDATE June 24th 2021: He is accompanied by his wife of more than 20 years today's clinical visit, wife noticed sudden onset confusion since September 2020, one morning they woke up started the daily routine, when he was trying to get out of the front door, he was frustrated that he could  not open the door, wife noted that he did not realize that the door was locked, he later was admitted to the hospital few times with diagnosis of pneumonia in December 2020, per wife, COVID-19 test was negative, it took him 2 months for his pneumonia to clear up, patient is very quiet, rely on his wife to provide history, this is also a change from him, he was noted to have mild slurred speech,  Wife reported that he went on disability more than decades ago due to right eye blind vision, diabetes complications, he graduated from 10th grade, used to work at Architect work, he likes to talk, enjoys music, like to work in his yard, now he sitts down quiet most of the time,  I  reviewed his cardiologist evaluation on Dec 31, 2019 by Dr.Suresh Bronson Ing, chronic combined heart failure/chest pain/shortness of breath, nuclear medicine showed no evidence of b perfusion defect, considered to be a high risk study as evident of decreased ejection fraction of 31%, he was started on Toprol-XL 12.5 mg daily, also was advised to weight daily, Lasix 40 mg as needed, potassium supplement, stop amlodipine, he was given Lasix, has to wear diaper because urinary frequency occasionally incontinence  Echo showed significant decreased EF 25-30%, global hypokinesia, was referred to cardiology.    Carotid US showed less than 39% stenosis of bilateral internal carotid arteries, anterograde flow of bilateral vertebral artery.  MRI of the brain has yet to be completed.  Myocardial perfusion study on January 28, 2020, interpreted by his cardiologist Dr. Carlyle Dolly, no signs of any blockage on stress test  We personally reviewed MRI of the brain without contrast January 23, 2020, acute right cerebellar positive DWI lesion, multiple supratentorial lesions affecting left frontal periventricular white matter, pontine area, left thalamus, bilateral corona radiata  Differentiation diagnosis include small vessel disease, cannot rule  out possibility of MS  REVIEW OF SYSTEMS: Out of a complete 14 system review of symptoms, the patient complains only of the following symptoms, and all other reviewed systems are negative.  Walking difficulty  ALLERGIES: No Known Allergies  HOME MEDICATIONS: Outpatient Medications Prior to Visit  Medication Sig Dispense Refill  . aspirin 81 MG chewable tablet Chew by mouth daily.    Marland Kitchen atorvastatin (LIPITOR) 20 MG tablet Take 20 mg by mouth every morning.     . dapagliflozin propanediol (FARXIGA) 5 MG TABS tablet Take 5 mg by mouth daily.    . furosemide (LASIX) 40 MG tablet Take daily as needed for weight gain over 3 lbs in 24 hours 90 tablet 3  . gabapentin (NEURONTIN) 300 MG capsule Take 300 mg by mouth 3 (three) times daily as needed (for nerve pain).     Marland Kitchen glipiZIDE (GLUCOTROL) 5 MG tablet Take 5 mg by mouth daily before breakfast.     . metoprolol succinate (TOPROL XL) 25 MG 24 hr tablet Take 0.5 tablets (12.5 mg total) by mouth daily. 45 tablet 3  . potassium chloride SA (KLOR-CON M20) 20 MEQ tablet Take 20 meq only on days you take lasix 90 tablet 3  . sacubitril-valsartan (ENTRESTO) 24-26 MG Take 1 tablet by mouth 2 (two) times daily. 60 tablet 6   No facility-administered medications prior to visit.    PAST MEDICAL HISTORY: Past Medical History:  Diagnosis Date  . Cervicalgia   . Confusion   . COPD (chronic obstructive pulmonary disease) ([place])   . Diabetes mellitus without complication ([place])   . Diabetic retinopathy of left eye with macular edema associated with diabetes mellitus due to underlying condition ([place])   . Dyslipidemia   . Hypertension   . Radiculopathy of cervical region     PAST SURGICAL HISTORY: Past Surgical History:  Procedure Laterality Date  . CHOLECYSTECTOMY      FAMILY HISTORY: Family History  Problem Relation Age of Onset  . Diabetes Mother   . Suicidality Father     SOCIAL HISTORY: Social History   Socioeconomic History  . Marital  status: Married    Spouse name: Not on file  . Number of children: 4  . Years of education: 11th grade  . Highest education level: Not on file  Occupational History  . Occupation: Disabled  Tobacco Use  . Smoking status: Current Some Day Smoker    Types: Cigarettes  . Smokeless tobacco: Never Used  . Tobacco comment: "cigarette every now and then"  Vaping Use  . Vaping Use: Never used  Substance and Sexual Activity  . Alcohol use: Not Currently    Comment: previously heavy drinker - "not in a couple of year"  . Drug use: Not Currently    Comment: previously used marijuana - "not since 06/2019"  . Sexual activity: Not on file  Other Topics Concern  . Not on file  Social History Narrative   Lives with his wife.   Right-handed.   Caffeine use: 2 cups coffee, 3 cups soda per day.   Social Determinants of Health   Financial Resource Strain:   . Difficulty of Paying Living Expenses:   Food Insecurity:   . Worried About [employer] of  Food in the Last Year:   . Jud in the Last Year:   Transportation Needs:   . Film/video editor (Medical):   Marland Kitchen Lack of Transportation (Non-Medical):   Physical Activity:   . Days of Exercise per Week:   . Minutes of Exercise per Session:   Stress:   . Feeling of Stress :   Social Connections:   . Frequency of Communication with Friends and Family:   . Frequency of Social Gatherings with Friends and Family:   . Attends Religious Services:   . Active Member of Clubs or Organizations:   . Attends Archivist Meetings:   Marland Kitchen Marital Status:   Intimate Partner Violence:   . Fear of Current or Ex-Partner:   . Emotionally Abused:   Marland Kitchen Physically Abused:   . Sexually Abused:       PHYSICAL EXAM  Vitals:   01/30/20 1512  BP: 122/70  Pulse: 77  Weight: 179 lb 8 oz (81.4 kg)  Height: 5\' 4"  (1.626 m)   Body mass index is 30.81 kg/m.  Generalized: Well developed, in no acute distress  MMSE - Mini Mental State Exam  11/21/2019  Orientation to time 1  Orientation to Place 3  Registration 3  Attention/ Calculation 0  Recall 2  Language- name 2 objects 2  Language- repeat 1  Language- follow 3 step command 3  Language- read & follow direction 1  Write a sentence 1  Copy design 0  Total score 17     PHYSICAL EXAMNIATION:  Gen: NAD, conversant, well nourised, well groomed                     Cardiovascular: Regular rate rhythm, no peripheral edema, warm, nontender. Eyes: Conjunctivae clear without exudates or hemorrhage Neck: Supple, no carotid bruits. Pulmonary: Clear to auscultation bilaterally   NEUROLOGICAL EXAM:  MENTAL STATUS: Speech/Cognition: He has mild slurred speech, rely on his wife to [place] medical history  CRANIAL NERVES: CN II: Right eyes blind, left pupil was reactive to light CN III, IV, VI: extraocular movement are normal. No ptosis. CN V: Facial sensation is intact to light touch. CN VII: Face is symmetric with normal eye closure and smile. CN VIII: Hearing is normal to casual conversation CN IX, X: Mild dysarthria. CN XI: Head turning and shoulder shrug are intact   MOTOR: He has left shoulder sling, moves right upper extremity, bilateral lower extremity without difficulty  REFLEXES: Reflexes are hypoactive and symmetric at the biceps, triceps, knees and ankles. Plantar responses are flexor.  SENSORY: Intact to pinprick, vibratory sensation  COORDINATION: There is no trunk or limb ataxia.    GAIT/STANCE: He needs push-up to get up from seated position, wide-based, cautious, unsteady  DIAGNOSTIC DATA (LABS, IMAGING, TESTING) - I reviewed patient records, labs, notes, testing and imaging myself where available.  Lab Results  Component Value Date   WBC 6.4 09/16/2019   HGB 11.7 (L) 09/16/2019   HCT 38.8 (L) 09/16/2019   MCV 81.9 09/16/2019   PLT 284 09/16/2019      Component Value Date/Time   NA 135 09/16/2019 1948   K 4.1 09/16/2019 1948   CL  99 09/16/2019 1948   CO2 27 09/16/2019 1948   GLUCOSE 58 (L) 09/16/2019 1948   BUN 53 (H) 09/16/2019 1948   CREATININE 2.67 (H) 09/16/2019 1948   CALCIUM 9.7 09/16/2019 1948   GFRNONAA 26 (L) 09/16/2019 1948   GFRAA 31 (L)  09/16/2019 1948   No results found for: CHOL, HDL, LDLCALC, LDLDIRECT, TRIG, CHOLHDL No results found for: HGBA1C No results found for: VITAMINB12 No results found for: TSH    ASSESSMENT AND PLAN 54 y.o. year old male    Abnormal MRI of the brain  Positive DWI lesion affecting right superior cerebellum, also multiple periventricular, thalamus, subcortical white matter changes, oriented towards ventricle,  Differentiation diagnosis include small vessel disease, cannot rule out possibility of Relapsing Remitting Multiple Sclerosis  He does have vascular risk factor of hypertension, hyperlipidemia, diabetes  Echocardiogram showed severely decreased EF 25 to 30%, was seen by cardiologist, normal perfusion study showed no evidence of hypoperfusion, no evidence of block, is on Lasix treatment now  Echocardiogram showed less than 30% stenosis of bilateral internal carotid artery  Laboratory evaluations for potential etiology  Lumbar puncture  Repeat MRI of the brain and cervical spine with without contrast  Traumatic closed displaced transcondylar fracture of left distal humerus in February 2021  He fell unattended due to confusion  Total time spent reviewing the chart, obtaining history, examined patient, ordering tests, documentation, consultations and family, care coordination was 60 minutes   Marcial Pacas, M.D. Ph.D.  [facility] [place], [place] [address] Phone: [phone] Fax:      [phone]

## 2020-02-03 ENCOUNTER — Telehealth: Payer: Self-pay | Admitting: Neurology

## 2020-02-03 LAB — CBC WITH DIFFERENTIAL/PLATELET
Basophils Absolute: 0.1 10*3/uL (ref 0.0–0.2)
Basos: 1 %
EOS (ABSOLUTE): 0.2 10*3/uL (ref 0.0–0.4)
Eos: 3 %
Hematocrit: 28.4 % — ABNORMAL LOW (ref 37.5–51.0)
Hemoglobin: 8.6 g/dL — ABNORMAL LOW (ref 13.0–17.7)
Immature Grans (Abs): 0 10*3/uL (ref 0.0–0.1)
Immature Granulocytes: 0 %
Lymphocytes Absolute: 2.4 10*3/uL (ref 0.7–3.1)
Lymphs: 33 %
MCH: 24.3 pg — ABNORMAL LOW (ref 26.6–33.0)
MCHC: 30.3 g/dL — ABNORMAL LOW (ref 31.5–35.7)
MCV: 80 fL (ref 79–97)
Monocytes Absolute: 0.7 10*3/uL (ref 0.1–0.9)
Monocytes: 10 %
Neutrophils Absolute: 3.7 10*3/uL (ref 1.4–7.0)
Neutrophils: 53 %
Platelets: 309 10*3/uL (ref 150–450)
RBC: 3.54 x10E6/uL — ABNORMAL LOW (ref 4.14–5.80)
RDW: 16.9 % — ABNORMAL HIGH (ref 11.6–15.4)
WBC: 7.1 10*3/uL (ref 3.4–10.8)

## 2020-02-03 LAB — HEPATITIS PANEL, ACUTE
Hep A IgM: NEGATIVE
Hep B C IgM: NEGATIVE
Hep C Virus Ab: <0.1 s/co ratio (ref 0.0–0.9)
Hepatitis B Surface Ag: NEGATIVE

## 2020-02-03 LAB — COMPREHENSIVE METABOLIC PANEL
ALT: 23 IU/L (ref 0–44)
AST: 18 IU/L (ref 0–40)
Albumin/Globulin Ratio: 1.1 — ABNORMAL LOW (ref 1.2–2.2)
Albumin: 3.7 g/dL — ABNORMAL LOW (ref 3.8–4.9)
Alkaline Phosphatase: 104 IU/L (ref 48–121)
BUN/Creatinine Ratio: 14 (ref 9–20)
BUN: 33 mg/dL — ABNORMAL HIGH (ref 6–24)
Bilirubin Total: <0.2 mg/dL (ref 0.0–1.2)
CO2: 22 mmol/L (ref 20–29)
Calcium: 9.2 mg/dL (ref 8.7–10.2)
Chloride: 109 mmol/L — ABNORMAL HIGH (ref 96–106)
Creatinine, Ser: 2.36 mg/dL — ABNORMAL HIGH (ref 0.76–1.27)
GFR calc Af Amer: 36 mL/min/{1.73_m2} — ABNORMAL LOW (ref >59)
GFR calc non Af Amer: 31 mL/min/{1.73_m2} — ABNORMAL LOW (ref >59)
Globulin, Total: 3.4 g/dL (ref 1.5–4.5)
Glucose: 146 mg/dL — ABNORMAL HIGH (ref 65–99)
Potassium: 5 mmol/L (ref 3.5–5.2)
Sodium: 144 mmol/L (ref 134–144)
Total Protein: 7.1 g/dL (ref 6.0–8.5)

## 2020-02-03 LAB — SAR COV2 SEROLOGY (COVID19)AB(IGG),IA: DiaSorin SARS-CoV-2 Ab, IgG: NEGATIVE

## 2020-02-03 LAB — ANA W/REFLEX IF POSITIVE: Anti Nuclear Antibody (ANA): NEGATIVE

## 2020-02-03 LAB — MULTIPLE MYELOMA PANEL, SERUM
Albumin SerPl Elph-Mcnc: 3.3 g/dL (ref 2.9–4.4)
Albumin/Glob SerPl: 0.9 (ref 0.7–1.7)
Alpha 1: 0.3 g/dL (ref 0.0–0.4)
Alpha2 Glob SerPl Elph-Mcnc: 0.8 g/dL (ref 0.4–1.0)
B-Globulin SerPl Elph-Mcnc: 1.1 g/dL (ref 0.7–1.3)
Gamma Glob SerPl Elph-Mcnc: 1.6 g/dL (ref 0.4–1.8)
Globulin, Total: 3.8 g/dL (ref 2.2–3.9)
IgA/Immunoglobulin A, Serum: 209 mg/dL (ref 90–386)
IgG (Immunoglobin G), Serum: 1632 mg/dL — ABNORMAL HIGH (ref 603–1613)
IgM (Immunoglobulin M), Srm: 125 mg/dL (ref 20–172)

## 2020-02-03 LAB — FERRITIN: Ferritin: 17 ng/mL — ABNORMAL LOW (ref 30–400)

## 2020-02-03 LAB — TSH: TSH: 2.64 u[IU]/mL (ref 0.450–4.500)

## 2020-02-03 LAB — C-REACTIVE PROTEIN: CRP: 2 mg/L (ref 0–10)

## 2020-02-03 LAB — VITAMIN B12: Vitamin B-12: 623 pg/mL (ref 232–1245)

## 2020-02-03 LAB — TROPONIN T: Troponin T (Highly Sensitive): 73 ng/L (ref 0–22)

## 2020-02-03 LAB — QUANTIFERON-TB GOLD PLUS
QuantiFERON Mitogen Value: >10 IU/mL
QuantiFERON Nil Value: 0 IU/mL
QuantiFERON TB1 Ag Value: 0 IU/mL
QuantiFERON TB2 Ag Value: 0 IU/mL
QuantiFERON-TB Gold Plus: NEGATIVE

## 2020-02-03 LAB — IRON AND TIBC
Iron Saturation: 6 % — CL (ref 15–55)
Iron: 26 ug/dL — ABNORMAL LOW (ref 38–169)
Total Iron Binding Capacity: 438 ug/dL (ref 250–450)
UIBC: 412 ug/dL — ABNORMAL HIGH (ref 111–343)

## 2020-02-03 LAB — FOLATE: Folate: 11.8 ng/mL (ref >3.0)

## 2020-02-03 LAB — VITAMIN D 25 HYDROXY (VIT D DEFICIENCY, FRACTURES): Vit D, 25-Hydroxy: 13.4 ng/mL — ABNORMAL LOW (ref 30.0–100.0)

## 2020-02-03 LAB — PAN-ANCA
ANCA Proteinase 3: <3.5 U/mL (ref 0.0–3.5)
Atypical pANCA: <1:20 {titer}
C-ANCA: <1:20 {titer}
Myeloperoxidase Ab: <9 U/mL (ref 0.0–9.0)
P-ANCA: <1:20 {titer}

## 2020-02-03 LAB — B. BURGDORFI ANTIBODIES: Lyme IgG/IgM Ab: <0.91 {ISR} (ref 0.00–0.90)

## 2020-02-03 LAB — HIV ANTIBODY (ROUTINE TESTING W REFLEX): HIV Screen 4th Generation wRfx: NONREACTIVE

## 2020-02-03 LAB — ANGIOTENSIN CONVERTING ENZYME: Angio Convert Enzyme: 74 U/L (ref 14–82)

## 2020-02-03 LAB — CK: Total CK: 459 U/L — ABNORMAL HIGH (ref 41–331)

## 2020-02-03 LAB — RPR: RPR Ser Ql: NONREACTIVE

## 2020-02-03 LAB — SEDIMENTATION RATE: Sed Rate: 49 mm/hr — ABNORMAL HIGH (ref 0–30)

## 2020-02-03 LAB — COPPER, SERUM: Copper: 115 ug/dL (ref 69–132)

## 2020-02-03 NOTE — Telephone Encounter (Signed)
I have spoken to his wife, [name]. She verbalized understanding of her the results below. She will start him on the vitamin D3 supplement. She will contact his PCP in the morning to schedule an appt to address the other concerns. She will take him to the ED for any shortness of breath or chest pain.

## 2020-02-03 NOTE — Telephone Encounter (Signed)
Mcarthur Rossetti Josem Kaufmann: [id number] (exp. 02/03/20 to 03/04/20) order sent to GI. They will reach out to the patient to schedule.

## 2020-02-03 NOTE — Telephone Encounter (Signed)
Please call patient, I have faxed the lab results to his primary care physician Dr. Neale Burly, MD   1.  Elevated troponin 73, if he has chest pain, shortness of breath, he should contact his primary care physician or go to emergency room, even if he does not have above acute symptoms, he needs to contact his primary care physician for further evaluation of his cardiac function, troponin level  2.  Elevated creatinine 2.36, decreased GFR of 36, which is about his baseline  3.  Vitamin D deficiency 13.4, he should start taking over-the-counter vitamin D3 supplement 1000 units, 2 tablets daily  4.  CBC showed anemia hemoglobin of 8.6, which is a significant drop compared to previous baseline of 11.7, with evidence of microcytic anemia, decreased MCH 24, MCHC of 30, elevated RDW 16.9, decreased ferritin of 17, iron saturation of 6  Elevated ESR 49 can be related to his anemia  He should contact his primary care physician for evaluation of anemia  Rest of the laboratory evaluation showed no significant abnormalities.

## 2020-02-04 ENCOUNTER — Telehealth: Payer: Self-pay | Admitting: *Deleted

## 2020-02-04 NOTE — Telephone Encounter (Addendum)
Labs collected 01/30/20:  JCV ab 3.80 (H) positive  Results provided to Dr. Krista Blue for review. Copy will be sent for scanning.

## 2020-02-05 DIAGNOSIS — N182 Chronic kidney disease, stage 2 (mild): Secondary | ICD-10-CM | POA: Diagnosis not present

## 2020-02-05 DIAGNOSIS — E785 Hyperlipidemia, unspecified: Secondary | ICD-10-CM | POA: Diagnosis not present

## 2020-02-05 DIAGNOSIS — I5022 Chronic systolic (congestive) heart failure: Secondary | ICD-10-CM | POA: Diagnosis not present

## 2020-02-05 DIAGNOSIS — F0151 Vascular dementia with behavioral disturbance: Secondary | ICD-10-CM | POA: Diagnosis not present

## 2020-02-05 DIAGNOSIS — I10 Essential (primary) hypertension: Secondary | ICD-10-CM | POA: Diagnosis not present

## 2020-02-05 DIAGNOSIS — Z6830 Body mass index (BMI) 30.0-30.9, adult: Secondary | ICD-10-CM | POA: Diagnosis not present

## 2020-02-05 DIAGNOSIS — E1121 Type 2 diabetes mellitus with diabetic nephropathy: Secondary | ICD-10-CM | POA: Diagnosis not present

## 2020-02-05 DIAGNOSIS — N3942 Incontinence without sensory awareness: Secondary | ICD-10-CM | POA: Diagnosis not present

## 2020-02-25 ENCOUNTER — Ambulatory Visit: Payer: Medicare HMO | Admitting: Neurology

## 2020-02-26 ENCOUNTER — Other Ambulatory Visit: Payer: Self-pay

## 2020-02-26 ENCOUNTER — Ambulatory Visit (INDEPENDENT_AMBULATORY_CARE_PROVIDER_SITE_OTHER): Payer: Medicare HMO | Admitting: Neurology

## 2020-02-26 DIAGNOSIS — R269 Unspecified abnormalities of gait and mobility: Secondary | ICD-10-CM

## 2020-02-26 DIAGNOSIS — R410 Disorientation, unspecified: Secondary | ICD-10-CM

## 2020-02-26 DIAGNOSIS — R930 Abnormal findings on diagnostic imaging of skull and head, not elsewhere classified: Secondary | ICD-10-CM

## 2020-02-27 ENCOUNTER — Ambulatory Visit: Payer: Medicare HMO | Admitting: Neurology

## 2020-03-12 NOTE — Procedures (Signed)
   HISTORY: 54 year old male presented with increased confusion  TECHNIQUE:  This is a routine 16 channel EEG recording with one channel devoted to a limited EKG recording.  It was performed during wakefulness, drowsiness and asleep.  Hyperventilation and photic stimulation were performed as activating procedures.  There are minimum muscle and movement artifact noted.  Upon maximum arousal, posterior dominant waking rhythm consistent of mildly dysrhythmic activity, 8 Hz, theta range. Activities are symmetric over the bilateral posterior derivations and attenuated with eye opening.  Hyperventilation produced mild/moderate buildup with higher amplitude and the slower activities noted.  Photic stimulation did not alter the tracing.  During EEG recording, patient developed drowsiness and no deeper stage of sleep was achieved During EEG recording, there was no epileptiform discharge noted.  EKG demonstrate sinus rhythm, with heart rate of 84 bpm  CONCLUSION: This is a  normal awake EEG.  There is no electrodiagnostic evidence of epileptiform discharge.  Marcial Pacas, M.D. Ph.D.  [facility] [place], [place] [address] Phone: [phone] Fax:      [phone]

## 2020-03-23 ENCOUNTER — Ambulatory Visit: Payer: Medicare HMO | Admitting: Cardiovascular Disease

## 2020-04-01 ENCOUNTER — Encounter: Payer: Self-pay | Admitting: Cardiology

## 2020-04-01 ENCOUNTER — Ambulatory Visit (INDEPENDENT_AMBULATORY_CARE_PROVIDER_SITE_OTHER): Payer: Medicare HMO | Admitting: Cardiology

## 2020-04-01 ENCOUNTER — Other Ambulatory Visit: Payer: Self-pay

## 2020-04-01 VITALS — BP 138/80 | HR 71 | Ht 64.0 in | Wt 190.8 lb

## 2020-04-01 DIAGNOSIS — I10 Essential (primary) hypertension: Secondary | ICD-10-CM | POA: Diagnosis not present

## 2020-04-01 DIAGNOSIS — I429 Cardiomyopathy, unspecified: Secondary | ICD-10-CM

## 2020-04-01 NOTE — Progress Notes (Signed)
Cardiology Office Note  Date: 04/01/2020   ID: Luis Riley, DOB 09-15-1965, MRN [id number]  PCP:  Neale Burly, MD  Cardiologist:  Rozann Lesches, MD Electrophysiologist:  None   Chief Complaint  Patient presents with  . Cardiac follow-up    History of Present Illness: Luis Riley is a 54 y.o. male former patient of Dr. Bronson Ing now presenting to establish follow-up with me.  I reviewed his records and updated the chart.  He was last seen in May.  He is here today with his significant other.  He tells me that he has been doing reasonably well, does not report any progressive shortness of breath, no chest pain, palpitations, or syncope.  He is using compression stockings with reasonable control of leg edema.  Dobutamine Myoview from June indicated no obvious regions of scar or ischemia, abnormal ST segment changes noted.  LVEF was in the range of 25 to 30% with global hypokinesis by echocardiogram in May.  I reviewed his medications which are outlined below.  He was switched from lisinopril to [facility] which he is tolerating.  Weight is up significantly from June, but he has remained on Lasix with potassium supplements.  No recent BMET.  Past Medical History:  Diagnosis Date  . Cardiomyopathy ([place])   . Confusion   . COPD (chronic obstructive pulmonary disease) ([place])   . Diabetic retinopathy of left eye with macular edema associated with diabetes mellitus due to underlying condition ([place])   . Essential hypertension   . Mixed hyperlipidemia   . Radiculopathy of cervical region   . Type 2 diabetes mellitus ([place])     Past Surgical History:  Procedure Laterality Date  . CHOLECYSTECTOMY      Current Outpatient Medications  Medication Sig Dispense Refill  . aspirin 81 MG chewable tablet Chew by mouth daily.    Marland Kitchen atorvastatin (LIPITOR) 20 MG tablet Take 20 mg by mouth every morning.     . Cholecalciferol (VITAMIN D3 PO) Take 2,000 Units by mouth daily.    . dapagliflozin  propanediol (FARXIGA) 5 MG TABS tablet Take 5 mg by mouth daily.    . furosemide (LASIX) 20 MG tablet Take 20 mg by mouth daily. Gives another 20mg  tab as needed for weight gain.    Marland Kitchen gabapentin (NEURONTIN) 300 MG capsule Take 300 mg by mouth 3 (three) times daily as needed (for nerve pain).     Marland Kitchen glipiZIDE (GLUCOTROL) 5 MG tablet Take 5 mg by mouth daily before breakfast.     . metoprolol succinate (TOPROL XL) 25 MG 24 hr tablet Take 0.5 tablets (12.5 mg total) by mouth daily. 45 tablet 3  . potassium chloride SA (KLOR-CON M20) 20 MEQ tablet Take 20 meq only on days you take lasix 90 tablet 3  . sacubitril-valsartan (ENTRESTO) 24-26 MG Take 1 tablet by mouth 2 (two) times daily. 60 tablet 6   No current facility-administered medications for this visit.   Allergies:  Patient has no known allergies.   Social History: The patient  reports that he has been smoking cigarettes. He has never used smokeless tobacco. He reports previous alcohol use. He reports previous drug use.   Family History: The patient's family history includes Diabetes in his mother; Suicidality in his father.   ROS:   No palpitations or syncope.  Physical Exam: VS:  BP 138/80   Pulse 71   Ht 5\' 4"  (1.626 m)   Wt 190 lb 12.8 oz (86.5 kg)   SpO2  96%   BMI 32.75 kg/m , BMI Body mass index is 32.75 kg/m.  Wt Readings from Last 3 Encounters:  04/01/20 190 lb 12.8 oz (86.5 kg)  01/30/20 179 lb 8 oz (81.4 kg)  12/31/19 176 lb (79.8 kg)    General: Patient appears comfortable at rest. HEENT: Conjunctiva and lids normal, wearing a mask. Neck: Supple, no elevated JVP or carotid bruits, no thyromegaly. Lungs: Clear to auscultation, nonlabored breathing at rest. Cardiac: Regular rate and rhythm, no S3, soft systolic murmur, no pericardial rub. Abdomen: Soft, nontender, bowel sounds present. Extremities: No extremity edema with compression stockings in place, distal pulses 2+.  ECG:  An ECG dated 09/16/2019 was personally  reviewed today and demonstrated:  Sinus rhythm with possible biatrial enlargement, IVCD.  Recent Labwork: 01/30/2020: ALT 23; AST 18; BUN 33; Creatinine, Ser 2.36; Hemoglobin 8.6; Platelets 309; Potassium 5.0; Sodium 144; TSH 2.640   Other Studies Reviewed Today:  Echocardiogram 12/10/2019: 1. Severe global reduction in LV systolic function; mild LVE; grade 2  diastolic dysfunction.  2. Left ventricular ejection fraction, by estimation, is 25 to 30%. The  left ventricle has severely decreased function. The left ventricle  demonstrates global hypokinesis. The left ventricular internal cavity size  was mildly dilated. Left ventricular  diastolic parameters are consistent with Grade II diastolic dysfunction  (pseudonormalization). Elevated left atrial pressure.  3. Right ventricular systolic function is normal. The right ventricular  size is normal.  4. The mitral valve is normal in structure. Trivial mitral valve  regurgitation. No evidence of mitral stenosis.  5. The aortic valve is tricuspid. Aortic valve regurgitation is not  visualized. No aortic stenosis is present.  6. The inferior vena cava is normal in size with greater than 50%  respiratory variability, suggesting right atrial pressure of 3 mmHg.   Dobutamine echocardiogram 01/27/2020:  The study is normal. There are no perfusion defects consistent with prior infarct or current ischemia.  This is a high risk study. Risk is based on decreased LVEF, there is no evidence of prior infarction or current ischemia. Consider correlating LVEF with echo.  The left ventricular ejection fraction is moderately decreased (31%).  Horizontal ST segment depression ST segment depression of 1 mm was noted during stress in the II, III and aVF leads.  Assessment and Plan:  1.  Secondary cardiomyopathy with LVEF approximately 25 to 30% by echocardiogram in May.  RV function normal.  Dobutamine echocardiogram in June did not report any obvious  ischemic territories or scar.  Plan at this point will be to manage as nonischemic cardiomyopathy with medical therapy.  He is currently on Toprol-XL, Entresto,, Lasix, and potassium supplements.  I would like to get a follow-up echocardiogram for reassessment and then proceed from there.  Also need BMET.  2.  History of tobacco abuse and COPD.  3.  Essential hypertension, no changes made to medical regimen today.  Anticipate further adjustments pending echocardiogram results.  Medication Adjustments/Labs and Tests Ordered: Current medicines are reviewed at length with the patient today.  Concerns regarding medicines are outlined above.   Tests Ordered: Orders Placed This Encounter  Procedures  . Basic metabolic panel  . ECHOCARDIOGRAM COMPLETE    Medication Changes: No orders of the defined types were placed in this encounter.   Disposition:  Follow up test results and determine next step.  Signed, Satira Sark, MD, [facility] 04/01/2020 4:05 PM    [place] [facility] [place], [place], [place]  [place] Phone: [phone]; Fax: [phone]

## 2020-04-01 NOTE — Patient Instructions (Addendum)
Medication Instructions:   Your physician recommends that you continue on your current medications as directed. Please refer to the Current Medication list given to you today.  Labwork:  Your physician recommends that you return for lab work in: TODAY to check your BMET. This may be done at [facility].  Testing/Procedures: Your physician has requested that you have an echocardiogram. Echocardiography is a painless test that uses sound waves to create images of your heart. It provides your doctor with information about the size and shape of your heart and how well your hearts chambers and valves are working. This procedure takes approximately one hour. There are no restrictions for this procedure.  Follow-Up:  Your physician recommends that you schedule a follow-up appointment in: pending.  Any Other Special Instructions Will Be Listed Below (If Applicable).  If you need a refill on your cardiac medications before your next appointment, please call your pharmacy.

## 2020-04-02 ENCOUNTER — Telehealth: Payer: Self-pay | Admitting: *Deleted

## 2020-04-02 ENCOUNTER — Ambulatory Visit: Payer: Medicare HMO | Admitting: Neurology

## 2020-04-02 MED ORDER — POTASSIUM CHLORIDE CRYS ER 20 MEQ PO TBCR
EXTENDED_RELEASE_TABLET | ORAL | 3 refills | Status: DC
Start: 2020-04-02 — End: 2020-05-08

## 2020-04-02 NOTE — Telephone Encounter (Signed)
-----  Message from Satira Sark, MD sent at 04/02/2020  8:23 AM EDT ----- Results reviewed.  Renal function has worsened since last check in June, creatinine up from 2.3-3.1 and potassium 5.3.  Please ask him to stop Entresto and also hold potassium supplements.  Schedule an office follow-up visit in 1 week with Jonni Sanger or [place] for clinical reevaluation.  His weight was up when I met him yesterday from prior baseline.  Follow-up echocardiogram pending.  This may be cardiorenal syndrome and a situation where he needs to be admitted for IV diuresis and further optimization of his medical therapy depending on renal function trend and clinical progress.

## 2020-04-02 NOTE — Telephone Encounter (Signed)
Patient's wife informed and verbalized understanding of plan. Copy sent to PCP

## 2020-04-08 NOTE — Progress Notes (Signed)
Cardiology Office Note  Date: 04/09/2020   ID: Luis Riley, DOB Jun 06, 1966, MRN [id number]  PCP:  Luis Burly, MD  Cardiologist:  Luis Lesches, MD Electrophysiologist:  None   Chief Complaint: Follow-up secondary cardiomyopathy  History of Present Illness: Luis Riley is a 54 y.o. male with a history of secondary cardiomyopathy, tobacco abuse, HTN, type 2 diabetes, with diabetic retinopathy of left eye with macular edema, HLD, COPD.  Previous patient of Dr. Court Riley.  Recently established with Dr. Domenic Riley on 04/01/2020.  He had been doing reasonably well and no report of progressive shortness of breath, chest pain, palpitations, syncope.  He was using compression stockings things with reasonable control of lower extremity edema.  Had a recent dobutamine Myoview June 2021 with no obvious evidence of scar or ischemia.  Abnormal ST-T segment changes noted.  LVEF 25 to 30% with global hypokinesis by echocardiogram in May.  His weight was up significantly from June but he remained on Lasix with potassium supplements.  Plan was to repeat echocardiogram for reassessment with a follow-up basic metabolic panel and proceed from there.  Recent telephone encounter with message from Dr. Domenic Riley on 04/02/2020.  His renal function had worsened since his previous check in June.  Creatinine was up from 2.3-3.1 and potassium was 5.3.  He was asked to stop his and Entresto and hold potassium supplements and was to follow-up in 1 week.  He had a follow-up echocardiogram pending.  Dr. Domenic Riley stated this may be cardiorenal syndrome and situation where he needed to be admitted for diuresis and optimization of medical therapy depending on her renal function trend and clinical progress.  He presents today with no particular complaints.  He denies any anginal or exertional symptoms, palpitations or arrhythmias, orthostatic symptoms, CVA or TIA-like symptoms, PND, orthopnea, bleeding, claudication-like  symptoms, DVT or PE-like symptoms, or lower extremity edema.  He is wearing compression stockings today.  Blood pressure is elevated on arrival at 140/78. He does not appear hypervolemic.  He is going to undergo a repeat echocardiogram today in the clinic after he is seen today.  He has lost approximately 2 pounds since 04/01/2020  Past Medical History:  Diagnosis Date  . Cardiomyopathy ([place])   . Confusion   . COPD (chronic obstructive pulmonary disease) ([place])   . Diabetic retinopathy of left eye with macular edema associated with diabetes mellitus due to underlying condition ([place])   . Essential hypertension   . Mixed hyperlipidemia   . Radiculopathy of cervical region   . Type 2 diabetes mellitus ([place])     Past Surgical History:  Procedure Laterality Date  . CHOLECYSTECTOMY      Current Outpatient Medications  Medication Sig Dispense Refill  . aspirin 81 MG chewable tablet Chew by mouth daily.    Marland Kitchen atorvastatin (LIPITOR) 20 MG tablet Take 20 mg by mouth every morning.     . Cholecalciferol (VITAMIN D3 PO) Take 2,000 Units by mouth daily.    . dapagliflozin propanediol (FARXIGA) 5 MG TABS tablet Take 5 mg by mouth daily.    . furosemide (LASIX) 20 MG tablet Take 20 mg by mouth daily. Gives another 20mg  tab as needed for weight gain.    Marland Kitchen gabapentin (NEURONTIN) 300 MG capsule Take 300 mg by mouth 3 (three) times daily as needed (for nerve pain).     Marland Kitchen glipiZIDE (GLUCOTROL) 5 MG tablet Take 5 mg by mouth daily before breakfast.     . metoprolol succinate (TOPROL XL) 25  MG 24 hr tablet Take 0.5 tablets (12.5 mg total) by mouth daily. 45 tablet 3  . potassium chloride SA (KLOR-CON M20) 20 MEQ tablet 04/02/2020 HOLD  Take 20 meq only on days you take lasix 90 tablet 3   No current facility-administered medications for this visit.   Allergies:  Patient has no known allergies.   Social History: The patient  reports that he has been smoking cigarettes. He has never used smokeless tobacco.  He reports previous alcohol use. He reports previous drug use.   Family History: The patient's family history includes Diabetes in his mother; Suicidality in his father.   ROS:  Please see the history of present illness. Otherwise, complete review of systems is positive for none.  All other systems are reviewed and negative.   Physical Exam: VS:  BP 140/78   Pulse 78   Ht 5\' 4"  (1.626 m)   Wt 188 lb (85.3 kg)   SpO2 99%   BMI 32.27 kg/m , BMI Body mass index is 32.27 kg/m.  Wt Readings from Last 3 Encounters:  04/09/20 188 lb (85.3 kg)  04/01/20 190 lb 12.8 oz (86.5 kg)  01/30/20 179 lb 8 oz (81.4 kg)    General: Patient appears comfortable at rest. Neck: Supple, no elevated JVP or carotid bruits, no thyromegaly. Lungs: Clear to auscultation, nonlabored breathing at rest. Cardiac: Regular rate and rhythm, no S3 or significant systolic murmur, no pericardial rub. Extremities: No pitting edema, distal pulses 2+. Skin: Warm and dry. Musculoskeletal: No kyphosis. Neuropsychiatric: Alert and oriented x3, affect grossly appropriate.  ECG:  EKG 09/16/2019: Sinus rhythm rate of 86, biatrial enlargement, right axis deviation, consider LVH, anterior Q waves possible due to LVH.  Recent Labwork: 01/30/2020: ALT 23; AST 18; BUN 33; Creatinine, Ser 2.36; Hemoglobin 8.6; Platelets 309; Potassium 5.0; Sodium 144; TSH 2.640  No results found for: CHOL, TRIG, HDL, CHOLHDL, VLDL, LDLCALC, LDLDIRECT  Other Studies Reviewed Today:  Echocardiogram 12/10/2019: 1. Severe global reduction in LV systolic function; mild LVE; grade 2  diastolic dysfunction.  2. Left ventricular ejection fraction, by estimation, is 25 to 30%. The  left ventricle has severely decreased function. The left ventricle  demonstrates global hypokinesis. The left ventricular internal cavity size  was mildly dilated. Left ventricular  diastolic parameters are consistent with Grade II diastolic dysfunction   (pseudonormalization). Elevated left atrial pressure.  3. Right ventricular systolic function is normal. The right ventricular  size is normal.  4. The mitral valve is normal in structure. Trivial mitral valve  regurgitation. No evidence of mitral stenosis.  5. The aortic valve is tricuspid. Aortic valve regurgitation is not  visualized. No aortic stenosis is present.  6. The inferior vena cava is normal in size with greater than 50%  respiratory variability, suggesting right atrial pressure of 3 mmHg.   Dobutamine echocardiogram 01/27/2020:  The study is normal. There are no perfusion defects consistent with prior infarct or current ischemia.  This is a high risk study. Risk is based on decreased LVEF, there is no evidence of prior infarction or current ischemia. Consider correlating LVEF with echo.  The left ventricular ejection fraction is moderately decreased (31%).  Horizontal ST segment depression ST segment depression of 1 mm was noted during stress in the II, III and aVF leads.   Assessment and Plan:  1. Secondary cardiomyopathy ([place])   2. History of tobacco abuse   3. Essential hypertension    1. Secondary cardiomyopathy [facility]) He has an echocardiogram  in the office today with [place] as soon as we finish the visit.  He denies any dyspnea, he has lost 2 pounds since April 01, 2020.  No significant lower extremity edema noted.  He is wearing compression stockings.  His Entresto was recently stopped due to increasing creatinine from 2.3-3.1 on recent lab work.  2. History of tobacco abuse History of tobacco abuse and COPD.  3. Essential hypertension Blood pressure is elevated at 140/78 today during office visit.  4.  Possible cardiorenal syndrome. Recent echocardiogram in May 2021 showed EF of 25 to 30% grade 2 DD with recent increase in creatinine from 2.3-3.1 after starting Entresto.  Entresto and potassium supplements have been held for now.  We are rechecking echo  today.  Mention to the patient we may need to refer him to nephrology.  Patient states he is okay with referral.  Get a basic metabolic panel in 1 week to follow-up  Medication Adjustments/Labs and Tests Ordered: Current medicines are reviewed at length with the patient today.  Concerns regarding medicines are outlined above.   Disposition: Follow-up with Dr. Domenic Riley or APP 1 month  Signed, Levell July, NP 04/09/2020 10:01 AM    [facility] at [place], [place], [place] [address] Phone: [phone]; Fax: [phone]

## 2020-04-09 ENCOUNTER — Ambulatory Visit (INDEPENDENT_AMBULATORY_CARE_PROVIDER_SITE_OTHER): Payer: Medicare HMO | Admitting: Family Medicine

## 2020-04-09 ENCOUNTER — Encounter: Payer: Self-pay | Admitting: Family Medicine

## 2020-04-09 ENCOUNTER — Telehealth: Payer: Self-pay | Admitting: *Deleted

## 2020-04-09 ENCOUNTER — Ambulatory Visit (INDEPENDENT_AMBULATORY_CARE_PROVIDER_SITE_OTHER): Payer: Medicare HMO

## 2020-04-09 ENCOUNTER — Other Ambulatory Visit: Payer: Self-pay

## 2020-04-09 VITALS — BP 140/78 | HR 78 | Ht 64.0 in | Wt 188.0 lb

## 2020-04-09 DIAGNOSIS — I429 Cardiomyopathy, unspecified: Secondary | ICD-10-CM | POA: Diagnosis not present

## 2020-04-09 DIAGNOSIS — I5042 Chronic combined systolic (congestive) and diastolic (congestive) heart failure: Secondary | ICD-10-CM

## 2020-04-09 DIAGNOSIS — I10 Essential (primary) hypertension: Secondary | ICD-10-CM | POA: Diagnosis not present

## 2020-04-09 DIAGNOSIS — N289 Disorder of kidney and ureter, unspecified: Secondary | ICD-10-CM

## 2020-04-09 DIAGNOSIS — Z87891 Personal history of nicotine dependence: Secondary | ICD-10-CM

## 2020-04-09 LAB — ECHOCARDIOGRAM COMPLETE
Area-P 1/2: 3.74 cm2
Calc EF: 48 %
Height: 64 in
MV M vel: 3.34 m/s
MV Peak grad: 44.6 mmHg
S' Lateral: 4 cm
Single Plane A2C EF: 48.5 %
Single Plane A4C EF: 45.3 %
Weight: 3008 oz

## 2020-04-09 NOTE — Addendum Note (Signed)
Addended by: Laurine Blazer on: 04/09/2020 10:48 AM   Modules accepted: Orders

## 2020-04-09 NOTE — Patient Instructions (Addendum)
Medication Instructions:  Continue all current medications.  Labwork:  BMET - order given today.   Please do in 1 week.   Office will contact with results via phone or letter.    Testing/Procedures: none  Follow-Up: 1 month  Any Other Special Instructions Will Be Listed Below (If Applicable).  If you need a refill on your cardiac medications before your next appointment, please call your pharmacy.

## 2020-04-09 NOTE — Telephone Encounter (Signed)
Wife informed. Copy sent to PCP 

## 2020-04-09 NOTE — Telephone Encounter (Signed)
-----   Message from Satira Sark, MD sent at 04/09/2020 12:59 PM EDT ----- Results reviewed.  Actually, somewhat surprised with significant improvement in LVEF, now approximately 50%.  I had been concerned recently about possible cardiorenal syndrome with fluid overload and worsening renal function, but this seems much less likely at this point.  He was already taken off Entresto and potassium supplements.  Keep scheduled follow-up, he may need to be referred to nephrology for consultation if not already done.

## 2020-04-15 ENCOUNTER — Ambulatory Visit: Payer: Medicare HMO | Admitting: Family Medicine

## 2020-04-17 DIAGNOSIS — L100 Pemphigus vulgaris: Secondary | ICD-10-CM | POA: Diagnosis not present

## 2020-04-27 ENCOUNTER — Telehealth: Payer: Self-pay | Admitting: *Deleted

## 2020-04-27 NOTE — Telephone Encounter (Signed)
Last seen on 04/09/2020 by Katina Dung, NP - BMET ordered to be done in 1 week.    Labs done at [facility] results are in "care everywhere" - please give disposition.

## 2020-04-28 NOTE — Telephone Encounter (Signed)
Wife [name]) notified & verbalized understanding.  Looks like Dr. Domenic Polite already ordered Nephrology referral to Dr. Theador Hawthorne.  Referral order faxed on 04/16/2020.  Wife states they have not heard from anyone yet - phone number provided.

## 2020-04-28 NOTE — Telephone Encounter (Signed)
His renal function has gotten worse.  Please refer him to Dr. Theador Hawthorne nephrology for management.  Please let him know his creatinine is now 3.15 and GFR is 25.  There was suspicion by Dr. Domenic Polite that he may have cardiorenal syndrome although his EF had improved but he still needs to be referred to nephrology.  Thank you

## 2020-05-07 DIAGNOSIS — Z6832 Body mass index (BMI) 32.0-32.9, adult: Secondary | ICD-10-CM | POA: Diagnosis not present

## 2020-05-07 DIAGNOSIS — N184 Chronic kidney disease, stage 4 (severe): Secondary | ICD-10-CM | POA: Diagnosis not present

## 2020-05-07 DIAGNOSIS — I10 Essential (primary) hypertension: Secondary | ICD-10-CM | POA: Diagnosis not present

## 2020-05-07 DIAGNOSIS — N3942 Incontinence without sensory awareness: Secondary | ICD-10-CM | POA: Diagnosis not present

## 2020-05-07 DIAGNOSIS — E1121 Type 2 diabetes mellitus with diabetic nephropathy: Secondary | ICD-10-CM | POA: Diagnosis not present

## 2020-05-07 DIAGNOSIS — I5022 Chronic systolic (congestive) heart failure: Secondary | ICD-10-CM | POA: Diagnosis not present

## 2020-05-07 DIAGNOSIS — F0151 Vascular dementia with behavioral disturbance: Secondary | ICD-10-CM | POA: Diagnosis not present

## 2020-05-07 DIAGNOSIS — E785 Hyperlipidemia, unspecified: Secondary | ICD-10-CM | POA: Diagnosis not present

## 2020-05-07 NOTE — Progress Notes (Addendum)
Cardiology Office Note  Date: 05/08/2020   ID: Luis Riley, DOB 30-Jul-1966, MRN [id number]  PCP:  Neale Burly, MD  Cardiologist:  Rozann Lesches, MD Electrophysiologist:  None   Chief Complaint: Follow-up secondary cardiomyopathy  History of Present Illness: Luis Riley is a 54 y.o. male with a history of secondary cardiomyopathy, tobacco abuse, HTN, type 2 diabetes, with diabetic retinopathy of left eye with macular edema, HLD, COPD.  Previous patient of Dr. Court Joy.  Recently established with Dr. Domenic Polite on 04/01/2020.  He had been doing reasonably well and no report of progressive shortness of breath, chest pain, palpitations, syncope.  He was using compression stockings things with reasonable control of lower extremity edema.  Had a recent dobutamine Myoview June 2021 with no obvious evidence of scar or ischemia.  Abnormal ST-T segment changes noted.  LVEF 25 to 30% with global hypokinesis by echocardiogram in May.  His weight was up significantly from June but he remained on Lasix with potassium supplements.  Plan was to repeat echocardiogram for reassessment with a follow-up basic metabolic panel and proceed from there.  Recent telephone encounter with message from Dr. Domenic Polite on 04/02/2020.  His renal function had worsened since his previous check in June.  Creatinine was up from 2.3-3.1 and potassium was 5.3.  He was asked to stop his and Entresto and hold potassium supplements and was to follow-up in 1 week.  He had a follow-up echocardiogram pending.  Dr. Domenic Polite stated this may be cardiorenal syndrome and situation where he needed to be admitted for diuresis and optimization of medical therapy depending on renal function trend and clinical progress.  Presented last visit with no particular complaints.  He denied any anginal or exertional symptoms, palpitations or arrhythmias, orthostatic symptoms, CVA or TIA-like symptoms, PND, orthopnea, bleeding, claudication-like  symptoms, DVT or PE-like symptoms, or lower extremity edema.  He was wearing compression stockings today.  Blood pressure is elevated on arrival at 140/78. He does not appear hypervolemic.  He was going to undergo a repeat echocardiogram  in the clinic after being seen that day today.  He had lost approximately 2 pounds since 04/01/2020  Presents today for 1 month follow-up status post recent echocardiogram.  Ejection fraction is much improved on recent echo.  EF is 50%  Past Medical History:  Diagnosis Date  . Cardiomyopathy ([place])   . Confusion   . COPD (chronic obstructive pulmonary disease) ([place])   . Diabetic retinopathy of left eye with macular edema associated with diabetes mellitus due to underlying condition ([place])   . Essential hypertension   . Mixed hyperlipidemia   . Radiculopathy of cervical region   . Type 2 diabetes mellitus ([place])     Past Surgical History:  Procedure Laterality Date  . CHOLECYSTECTOMY      Current Outpatient Medications  Medication Sig Dispense Refill  . amLODipine (NORVASC) 2.5 MG tablet Take 2.5 mg by mouth daily.    Marland Kitchen aspirin 81 MG chewable tablet Chew by mouth daily.    Marland Kitchen atorvastatin (LIPITOR) 20 MG tablet Take 20 mg by mouth every morning.     . Cholecalciferol (VITAMIN D3 PO) Take 2,000 Units by mouth daily.    . dapagliflozin propanediol (FARXIGA) 5 MG TABS tablet Take 5 mg by mouth daily.    . furosemide (LASIX) 20 MG tablet Take 20 mg by mouth daily. Gives another 20mg  tab as needed for weight gain.    Marland Kitchen gabapentin (NEURONTIN) 300 MG capsule Take 300 mg  by mouth 3 (three) times daily as needed (for nerve pain).     Marland Kitchen glipiZIDE (GLUCOTROL) 5 MG tablet Take 5 mg by mouth daily before breakfast.     . metoprolol succinate (TOPROL XL) 25 MG 24 hr tablet Take 0.5 tablets (12.5 mg total) by mouth daily. 45 tablet 3   No current facility-administered medications for this visit.   Allergies:  Patient has no known allergies.   Social History: The  patient  reports that he has been smoking cigarettes. He has never used smokeless tobacco. He reports previous alcohol use. He reports previous drug use.   Family History: The patient's family history includes Diabetes in his mother; Suicidality in his father.   ROS:  Please see the history of present illness. Otherwise, complete review of systems is positive for none.  All other systems are reviewed and negative.   Physical Exam: VS:  BP (!) 156/78   Pulse 72   Ht 5\' 4"  (1.626 m)   Wt 194 lb 6.4 oz (88.2 kg)   SpO2 98%   BMI 33.37 kg/m , BMI Body mass index is 33.37 kg/m.  Wt Readings from Last 3 Encounters:  05/08/20 194 lb 6.4 oz (88.2 kg)  04/09/20 188 lb (85.3 kg)  04/01/20 190 lb 12.8 oz (86.5 kg)    General: Patient appears comfortable at rest. Neck: Supple, no elevated JVP or carotid bruits, no thyromegaly. Lungs: Clear to auscultation, nonlabored breathing at rest. Cardiac: Regular rate and rhythm, no S3 or significant systolic murmur, no pericardial rub. Extremities: No pitting edema, distal pulses 2+. Skin: Warm and dry. Musculoskeletal: No kyphosis. Neuropsychiatric: Alert and oriented x3, affect grossly appropriate.  ECG:  EKG 09/16/2019: Sinus rhythm rate of 86, biatrial enlargement, right axis deviation, consider LVH, anterior Q waves possible due to LVH.  Recent Labwork: 01/30/2020: ALT 23; AST 18; BUN 33; Creatinine, Ser 2.36; Hemoglobin 8.6; Platelets 309; Potassium 5.0; Sodium 144; TSH 2.640  No results found for: CHOL, TRIG, HDL, CHOLHDL, VLDL, LDLCALC, LDLDIRECT  Other Studies Reviewed Today:  Echocardiogram 04/09/2020 1. Left ventricular ejection fraction, by estimation, is approximately 50%. The left ventricle has low normal function. The left ventricle has no regional wall motion abnormalities. Left ventricular diastolic parameters are consistent with Grade II diastolic dysfunction (pseudonormalization). 2. Right ventricular systolic function is normal.  The right ventricular size is normal. There is normal pulmonary artery systolic pressure. The estimated right ventricular systolic pressure is 02.6 mmHg. 3. Left atrial size was mild to moderately dilated. 4. The mitral valve is grossly normal. Mild mitral valve regurgitation. 5. The aortic valve is tricuspid. Aortic valve regurgitation is not visualized. 6. The inferior vena cava is normal in size with greater than 50% respiratory variability, suggesting right atrial pressure of 3 mmHg.   Echocardiogram 12/10/2019: 1. Severe global reduction in LV systolic function; mild LVE; grade 2  diastolic dysfunction.  2. Left ventricular ejection fraction, by estimation, is 25 to 30%. The  left ventricle has severely decreased function. The left ventricle  demonstrates global hypokinesis. The left ventricular internal cavity size  was mildly dilated. Left ventricular  diastolic parameters are consistent with Grade II diastolic dysfunction  (pseudonormalization). Elevated left atrial pressure.  3. Right ventricular systolic function is normal. The right ventricular  size is normal.  4. The mitral valve is normal in structure. Trivial mitral valve  regurgitation. No evidence of mitral stenosis.  5. The aortic valve is tricuspid. Aortic valve regurgitation is not  visualized. No aortic  stenosis is present.  6. The inferior vena cava is normal in size with greater than 50%  respiratory variability, suggesting right atrial pressure of 3 mmHg.   Dobutamine echocardiogram 01/27/2020:  The study is normal. There are no perfusion defects consistent with prior infarct or current ischemia.  This is a high risk study. Risk is based on decreased LVEF, there is no evidence of prior infarction or current ischemia. Consider correlating LVEF with echo.  The left ventricular ejection fraction is moderately decreased (31%).  Horizontal ST segment depression ST segment depression of 1 mm was noted during  stress in the II, III and aVF leads.   Assessment and Plan:   1. Secondary cardiomyopathy ([place]) No obvious sign of volume overload such as dyspnea or lower extremity edema.  He has gained some weight since last visit approximately 6 pounds.  His Entresto was recently stopped due to increasing creatinine from 2.3-3.1 on recent lab work.  Recent repeat echo: Improvement of EF from previous echo in May with EF of 25 to 30% to EF 50%, G2 DD, LA mild to moderately dilated, mild MR. Recent lab work from 05/07/2020 showed glucose 67, BUN 40, creatinine 2.77, GFR 29, sodium 142, potassium 4.8, calcium 9.3.  2. History of tobacco abuse History of tobacco abuse and COPD.  Continuing to smoke.  Advised him to stop.  3. Essential hypertension Blood pressure is elevated at 156/78 today during office visit.  PCP recently started him on amlodipine 2.5 mg.  He is going to pick that prescription up today and start the medication.  4.  Possible cardiorenal syndrome. Recent echocardiogram in May 2021 showed EF of 25 to 30% grade 2 DD with recent increase in creatinine from 2.3-3.1 after starting Entresto.  Entresto and potassium supplements have been held and will likely not be renewed due to decreased renal function.  Recent lab work collected on 04/01/2020 showed a creatinine of 3.14 and GFR of 25.  Patient has his first appointment with Dr. Theador Hawthorne soon.  Recent creatinine 2.77, GFR 29, potassium 4.8, on 05/07/2020  5.  Claudication/leg pain Patient states he has been walking with his wife and having calf pain which worsens when walking and is relieved at rest.  Please get lower extremity arterial duplex with ABIs.  6.  Hyperlipidemia Continue atorvastatin 20 mg p.o. daily.  Recent lipid panel: Total cholesterol 106, triglycerides 76, HDL 49, LDL 41.  7.  Diabetes type 2. Recent hemoglobin A1c 7.3% follows with PCP for diabetes.  Medication Adjustments/Labs and Tests Ordered: Current medicines are reviewed  at length with the patient today.  Concerns regarding medicines are outlined above.   Disposition: Follow-up with Dr. Domenic Polite or APP 1 month  Signed, Levell July, NP 05/08/2020 2:46 PM    [facility] at [place], [place], [place] [address] Phone: [phone]; Fax: [phone]

## 2020-05-08 ENCOUNTER — Encounter: Payer: Self-pay | Admitting: Family Medicine

## 2020-05-08 ENCOUNTER — Encounter: Payer: Self-pay | Admitting: *Deleted

## 2020-05-08 ENCOUNTER — Ambulatory Visit (INDEPENDENT_AMBULATORY_CARE_PROVIDER_SITE_OTHER): Payer: Medicare HMO | Admitting: Family Medicine

## 2020-05-08 VITALS — BP 156/78 | HR 72 | Ht 64.0 in | Wt 194.4 lb

## 2020-05-08 DIAGNOSIS — M79605 Pain in left leg: Secondary | ICD-10-CM | POA: Diagnosis not present

## 2020-05-08 DIAGNOSIS — E782 Mixed hyperlipidemia: Secondary | ICD-10-CM | POA: Diagnosis not present

## 2020-05-08 DIAGNOSIS — I429 Cardiomyopathy, unspecified: Secondary | ICD-10-CM

## 2020-05-08 DIAGNOSIS — E119 Type 2 diabetes mellitus without complications: Secondary | ICD-10-CM

## 2020-05-08 DIAGNOSIS — I10 Essential (primary) hypertension: Secondary | ICD-10-CM

## 2020-05-08 DIAGNOSIS — I1310 Hypertensive heart and chronic kidney disease without heart failure, with stage 1 through stage 4 chronic kidney disease, or unspecified chronic kidney disease: Secondary | ICD-10-CM

## 2020-05-08 DIAGNOSIS — M79604 Pain in right leg: Secondary | ICD-10-CM

## 2020-05-08 DIAGNOSIS — Z87891 Personal history of nicotine dependence: Secondary | ICD-10-CM | POA: Diagnosis not present

## 2020-05-08 NOTE — Patient Instructions (Signed)
Medication Instructions:   Your physician recommends that you continue on your current medications as directed. Please refer to the Current Medication list given to you today.  Labwork:  None  Testing/Procedures:  Your physician has requested that you have a lower extremity arterial exercise duplex. During this test, exercise and ultrasound are used to evaluate arterial blood flow in the legs. Allow one hour for this exam. There are no restrictions or special instructions. Your physician has requested that you have an ankle brachial index (ABI). During this test an ultrasound and blood pressure cuff are used to evaluate the arteries that supply the arms and legs with blood. Allow thirty minutes for this exam. There are no restrictions or special instructions.  Follow-Up:  Your physician recommends that you schedule a follow-up appointment in: 4 weeks.  Any Other Special Instructions Will Be Listed Below (If Applicable).  If you need a refill on your cardiac medications before your next appointment, please call your pharmacy.

## 2020-05-14 ENCOUNTER — Other Ambulatory Visit: Payer: Self-pay | Admitting: Family Medicine

## 2020-05-14 DIAGNOSIS — I739 Peripheral vascular disease, unspecified: Secondary | ICD-10-CM

## 2020-05-15 DIAGNOSIS — I5042 Chronic combined systolic (congestive) and diastolic (congestive) heart failure: Secondary | ICD-10-CM | POA: Diagnosis not present

## 2020-05-15 DIAGNOSIS — E559 Vitamin D deficiency, unspecified: Secondary | ICD-10-CM | POA: Diagnosis not present

## 2020-05-15 DIAGNOSIS — Z79899 Other long term (current) drug therapy: Secondary | ICD-10-CM | POA: Diagnosis not present

## 2020-05-15 DIAGNOSIS — N189 Chronic kidney disease, unspecified: Secondary | ICD-10-CM | POA: Diagnosis not present

## 2020-05-15 DIAGNOSIS — N170 Acute kidney failure with tubular necrosis: Secondary | ICD-10-CM | POA: Diagnosis not present

## 2020-05-15 DIAGNOSIS — Z716 Tobacco abuse counseling: Secondary | ICD-10-CM | POA: Diagnosis not present

## 2020-05-15 DIAGNOSIS — D638 Anemia in other chronic diseases classified elsewhere: Secondary | ICD-10-CM | POA: Diagnosis not present

## 2020-05-15 DIAGNOSIS — I129 Hypertensive chronic kidney disease with stage 1 through stage 4 chronic kidney disease, or unspecified chronic kidney disease: Secondary | ICD-10-CM | POA: Diagnosis not present

## 2020-05-15 DIAGNOSIS — E875 Hyperkalemia: Secondary | ICD-10-CM | POA: Diagnosis not present

## 2020-05-19 ENCOUNTER — Ambulatory Visit: Payer: Medicare HMO | Admitting: Neurology

## 2020-05-21 ENCOUNTER — Other Ambulatory Visit (HOSPITAL_COMMUNITY): Payer: Self-pay | Admitting: Nephrology

## 2020-05-21 DIAGNOSIS — Z79899 Other long term (current) drug therapy: Secondary | ICD-10-CM

## 2020-05-21 DIAGNOSIS — D638 Anemia in other chronic diseases classified elsewhere: Secondary | ICD-10-CM

## 2020-05-21 DIAGNOSIS — E559 Vitamin D deficiency, unspecified: Secondary | ICD-10-CM

## 2020-05-21 DIAGNOSIS — E1122 Type 2 diabetes mellitus with diabetic chronic kidney disease: Secondary | ICD-10-CM

## 2020-05-21 DIAGNOSIS — I129 Hypertensive chronic kidney disease with stage 1 through stage 4 chronic kidney disease, or unspecified chronic kidney disease: Secondary | ICD-10-CM

## 2020-05-21 DIAGNOSIS — N170 Acute kidney failure with tubular necrosis: Secondary | ICD-10-CM

## 2020-05-21 DIAGNOSIS — I5042 Chronic combined systolic (congestive) and diastolic (congestive) heart failure: Secondary | ICD-10-CM

## 2020-06-02 ENCOUNTER — Other Ambulatory Visit: Payer: Self-pay

## 2020-06-02 ENCOUNTER — Ambulatory Visit (HOSPITAL_COMMUNITY)
Admission: RE | Admit: 2020-06-02 | Discharge: 2020-06-02 | Disposition: A | Discharge status: Home / Self Care | Payer: Medicare HMO | Source: Ambulatory Visit | Attending: Nephrology | Admitting: Nephrology

## 2020-06-02 DIAGNOSIS — E1122 Type 2 diabetes mellitus with diabetic chronic kidney disease: Secondary | ICD-10-CM

## 2020-06-02 DIAGNOSIS — D638 Anemia in other chronic diseases classified elsewhere: Secondary | ICD-10-CM | POA: Diagnosis not present

## 2020-06-02 DIAGNOSIS — N170 Acute kidney failure with tubular necrosis: Secondary | ICD-10-CM | POA: Insufficient documentation

## 2020-06-02 DIAGNOSIS — E559 Vitamin D deficiency, unspecified: Secondary | ICD-10-CM | POA: Insufficient documentation

## 2020-06-02 DIAGNOSIS — Z79899 Other long term (current) drug therapy: Secondary | ICD-10-CM | POA: Insufficient documentation

## 2020-06-02 DIAGNOSIS — I5042 Chronic combined systolic (congestive) and diastolic (congestive) heart failure: Secondary | ICD-10-CM | POA: Diagnosis not present

## 2020-06-02 DIAGNOSIS — I129 Hypertensive chronic kidney disease with stage 1 through stage 4 chronic kidney disease, or unspecified chronic kidney disease: Secondary | ICD-10-CM | POA: Diagnosis not present

## 2020-06-02 DIAGNOSIS — N179 Acute kidney failure, unspecified: Secondary | ICD-10-CM | POA: Diagnosis not present

## 2020-06-04 ENCOUNTER — Telehealth: Payer: Self-pay | Admitting: *Deleted

## 2020-06-04 ENCOUNTER — Ambulatory Visit (INDEPENDENT_AMBULATORY_CARE_PROVIDER_SITE_OTHER): Payer: Medicare HMO

## 2020-06-04 DIAGNOSIS — I739 Peripheral vascular disease, unspecified: Secondary | ICD-10-CM

## 2020-06-04 NOTE — Telephone Encounter (Signed)
Pt voiced understanding and agreeable to vascular referral - placed and will forward to schedulers

## 2020-06-04 NOTE — Telephone Encounter (Signed)
-----   Message from Verta Ellen., NP sent at 06/04/2020 12:33 PM EDT -----  Please call the patient and let him know the arterial studies show he has some significant disease in both lower extremities.  Please refer him to vascular to get an appointment as soon as possible.  Thank you.  See report conclusion below    Summary: Right: Total occlusion noted in the superficial femoral artery. High grade stenosis in the distal end of the right mid SFA with a short segment occlusion in the distal right SFA.  Left: Total occlusion noted in the superficial femoral artery. Occluded left mid SFA with reconstitution via collaterals in the distal left SFA.

## 2020-06-10 ENCOUNTER — Telehealth: Payer: Self-pay | Admitting: Family Medicine

## 2020-06-10 NOTE — Progress Notes (Addendum)
This is a telemedicine visit Patient location: Home Provider location: Office  Cardiology Office Note  Date: 06/11/2020   ID: Luis Riley, DOB February 29, 1966, MRN [id number]  PCP:  Neale Burly, MD  Cardiologist:  Rozann Lesches, MD Electrophysiologist:  None   Chief Complaint: Follow-up secondary cardiomyopathy  History of Present Illness: Luis Riley is a 54 y.o. male with a history of secondary cardiomyopathy, tobacco abuse, HTN, type 2 diabetes, with diabetic retinopathy of left eye with macular edema, HLD, COPD.  Previous patient of Dr. Court Joy.  Recently established with Dr. Domenic Polite on 04/01/2020.  He had been doing reasonably well and no report of progressive shortness of breath, chest pain, palpitations, syncope.  He was using compression stockings things with reasonable control of lower extremity edema.  Had a recent dobutamine Myoview June 2021 with no obvious evidence of scar or ischemia.  Abnormal ST-T segment changes noted.  LVEF 25 to 30% with global hypokinesis by echocardiogram in May.  His weight was up significantly from June but he remained on Lasix with potassium supplements.  Plan was to repeat echocardiogram for reassessment with a follow-up basic metabolic panel and proceed from there.  Recent telephone encounter with message from Dr. Domenic Polite on 04/02/2020.  His renal function had worsened since his previous check in June.  Creatinine was up from 2.3-3.1 and potassium was 5.3.  He was asked to stop his and Entresto and hold potassium supplements and was to follow-up in 1 week.  He had a follow-up echocardiogram pending.  Dr. Domenic Polite stated this may be cardiorenal syndrome and situation where he needed to be admitted for diuresis and optimization of medical therapy depending on renal function trend and clinical progress.  Presented last visit with no particular complaints.  He denied any anginal or exertional symptoms, palpitations or arrhythmias, orthostatic  symptoms, CVA or TIA-like symptoms, PND, orthopnea, bleeding, claudication-like symptoms, DVT or PE-like symptoms, or lower extremity edema.  He was wearing compression stockings today.  Blood pressure is elevated on arrival at 140/78. He does not appear hypervolemic.  He was going to undergo a repeat echocardiogram  in the clinic after being seen that day today.  He had lost approximately 2 pounds since 04/01/2020  Presented last visit for 1 month follow-up status post recent echocardiogram.  Ejection fraction is much improved on recent echo.  EF is 50%  Spoke to wife and patient via telemedicine visit today.  Informed him his lower extremity arterial duplex and ABIs were abnormal.  His blood pressure today is elevated.  Wife states he is taking his medications as directed.  She is wondering if she should increase one of his blood pressure medications.  He has seen Dr. Theador Hawthorne and had a renal ultrasound performed which showed no significant issues.  Dr. Theador Hawthorne wanted him to have a 24-hour urine creatinine clearance study but wife states she is having a hard time obtaining enough urine for samples.  Otherwise he denies any other issues per her statement.  Past Medical History:  Diagnosis Date  . Cardiomyopathy ([place])   . Confusion   . COPD (chronic obstructive pulmonary disease) ([place])   . Diabetic retinopathy of left eye with macular edema associated with diabetes mellitus due to underlying condition ([place])   . Essential hypertension   . Mixed hyperlipidemia   . Radiculopathy of cervical region   . Type 2 diabetes mellitus ([place])     Past Surgical History:  Procedure Laterality Date  . CHOLECYSTECTOMY  Current Outpatient Medications  Medication Sig Dispense Refill  . amLODipine (NORVASC) 2.5 MG tablet Take 2.5 mg by mouth daily.    Marland Kitchen aspirin 81 MG chewable tablet Chew by mouth daily.    Marland Kitchen atorvastatin (LIPITOR) 20 MG tablet Take 20 mg by mouth every morning.     . Cholecalciferol  (VITAMIN D3 PO) Take 2,000 Units by mouth daily.    . dapagliflozin propanediol (FARXIGA) 5 MG TABS tablet Take 5 mg by mouth daily.    . furosemide (LASIX) 20 MG tablet Take 20 mg by mouth daily. Gives another 20mg  tab as needed for weight gain.    Marland Kitchen gabapentin (NEURONTIN) 300 MG capsule Take 300 mg by mouth 3 (three) times daily as needed (for nerve pain).     Marland Kitchen glipiZIDE (GLUCOTROL) 5 MG tablet Take 5 mg by mouth daily before breakfast.     . metoprolol succinate (TOPROL XL) 25 MG 24 hr tablet Take 0.5 tablets (12.5 mg total) by mouth daily. 45 tablet 3   No current facility-administered medications for this visit.   Allergies:  Patient has no known allergies.   Social History: The patient  reports that he has been smoking cigarettes. He has never used smokeless tobacco. He reports previous alcohol use. He reports previous drug use.   Family History: The patient's family history includes Diabetes in his mother; Suicidality in his father.   ROS:  Please see the history of present illness. Otherwise, complete review of systems is positive for none.  All other systems are reviewed and negative.   Physical Exam: VS:  BP (!) 150/73   Pulse 75   Ht 5\' 4"  (1.626 m)   Wt 191 lb (86.6 kg)   BMI 32.79 kg/m , BMI Body mass index is 32.79 kg/m.  Wt Readings from Last 3 Encounters:  06/11/20 191 lb (86.6 kg)  05/08/20 194 lb 6.4 oz (88.2 kg)  04/09/20 188 lb (85.3 kg)    Spoke to wife.  Patient is not a good historian.  She states he is doing well.  ECG:  EKG 09/16/2019: Sinus rhythm rate of 86, biatrial enlargement, right axis deviation, consider LVH, anterior Q waves possible due to LVH.  Recent Labwork: 01/30/2020: ALT 23; AST 18; BUN 33; Creatinine, Ser 2.36; Hemoglobin 8.6; Platelets 309; Potassium 5.0; Sodium 144; TSH 2.640  No results found for: CHOL, TRIG, HDL, CHOLHDL, VLDL, LDLCALC, LDLDIRECT  Other Studies Reviewed Today:   Lower arterial duplex and ABI studies 06/04/2020    Summary: Right: Total occlusion noted in the superficial femoral artery. High grade stenosis in the distal end of the right mid SFA with a short segment occlusion in the distal right SFA. Left: Total occlusion noted in the superficial femoral artery. Occluded left mid SFA with reconstitution via collaterals in the distal left SFA.  Summary: Right: Resting right ankle-brachial index indicates moderate right lower extremity arterial disease. The right toe-brachial index is abnormal. Left: Resting left ankle-brachial index indicates moderate left lower extremity arterial disease. The left toebrachial index is abnormal    Echocardiogram 04/09/2020 1. Left ventricular ejection fraction, by estimation, is approximately 50%. The left ventricle has low normal function. The left ventricle has no regional wall motion abnormalities. Left ventricular diastolic parameters are consistent with Grade II diastolic dysfunction (pseudonormalization). 2. Right ventricular systolic function is normal. The right ventricular size is normal. There is normal pulmonary artery systolic pressure. The estimated right ventricular systolic pressure is 50.3 mmHg. 3. Left atrial size was mild  to moderately dilated. 4. The mitral valve is grossly normal. Mild mitral valve regurgitation. 5. The aortic valve is tricuspid. Aortic valve regurgitation is not visualized. 6. The inferior vena cava is normal in size with greater than 50% respiratory variability, suggesting right atrial pressure of 3 mmHg.   Echocardiogram 12/10/2019: 1. Severe global reduction in LV systolic function; mild LVE; grade 2  diastolic dysfunction.  2. Left ventricular ejection fraction, by estimation, is 25 to 30%. The  left ventricle has severely decreased function. The left ventricle  demonstrates global hypokinesis. The left ventricular internal cavity size  was mildly dilated. Left ventricular  diastolic parameters are consistent with  Grade II diastolic dysfunction  (pseudonormalization). Elevated left atrial pressure.  3. Right ventricular systolic function is normal. The right ventricular  size is normal.  4. The mitral valve is normal in structure. Trivial mitral valve  regurgitation. No evidence of mitral stenosis.  5. The aortic valve is tricuspid. Aortic valve regurgitation is not  visualized. No aortic stenosis is present.  6. The inferior vena cava is normal in size with greater than 50%  respiratory variability, suggesting right atrial pressure of 3 mmHg.   Dobutamine echocardiogram 01/27/2020:  The study is normal. There are no perfusion defects consistent with prior infarct or current ischemia.  This is a high risk study. Risk is based on decreased LVEF, there is no evidence of prior infarction or current ischemia. Consider correlating LVEF with echo.  The left ventricular ejection fraction is moderately decreased (31%).  Horizontal ST segment depression ST segment depression of 1 mm was noted during stress in the II, III and aVF leads.   Assessment and Plan:   1. Secondary cardiomyopathy ([place]) He had gained some weight since previous visit approximately 6 pounds.  His Entresto was recently stopped due to increasing creatinine from 2.3-3.1 on recent lab work.  Recent repeat echo: Improvement of EF from previous echo in May with EF of 25 to 30% to EF 50%, G2 DD, LA mild to moderately dilated, mild MR. Recent lab work from 05/07/2020 showed glucose 67, BUN 40, creatinine 2.77, GFR 29, sodium 142, potassium 4.8, calcium 9.3.  2. History of tobacco abuse History of tobacco abuse and COPD.  Continuing to smoke.  Advised him to stop.  3. Essential hypertension Blood pressure is elevated today at 150/73.  Increase amlodipine to 5 mg p.o. daily.   4.  Possible cardiorenal syndrome. Recent echocardiogram in May 2021 showed EF of 25 to 30% grade 2 DD with recent increase in creatinine from 2.3-3.1 after  starting Entresto.  Entresto and potassium supplements have been held and will likely not be renewed due to decreased renal function.  Recent lab work collected on 04/01/2020 showed a creatinine of 3.14 and GFR of 25.   Recent creatinine 2.77, GFR 29, potassium 4.8, on 05/07/2020.  Recently saw Dr. Theador Hawthorne.  Dr. Theador Hawthorne wanted to do a 24-hour creatinine clearance study.  Recently had renal ultrasound which showed no significant issues.  He has an upcoming follow-up with Dr. Theador Hawthorne.  5.  Claudication/leg pain Patient had been walking with his wife and having calf pain which worsened when walking and is relieved at rest.  Patient recently had abnormal lower extremity arterial duplex and ABIs.  Please refer to vascular for evaluation and management.  6.  Hyperlipidemia Continue atorvastatin 20 mg p.o. daily.  Recent lipid panel: Total cholesterol 106, triglycerides 76, HDL 49, LDL 41.  7.  Diabetes type 2. Recent hemoglobin A1c  7.3% follows with PCP for diabetes.  Medication Adjustments/Labs and Tests Ordered: Current medicines are reviewed at length with the patient today.  Concerns regarding medicines are outlined above.   Time spent with patient 10 minutes during telemedicine visit   Disposition: Follow-up with Dr. Domenic Polite or APP 1 month  Signed, Levell July, NP 06/11/2020 10:22 AM    [facility] [address], [place], [place] [address] Phone: [phone]; Fax: [phone]

## 2020-06-10 NOTE — Telephone Encounter (Signed)
  Patient Consent for Virtual Visit         Luis Riley has provided verbal consent on 06/10/2020 for a virtual visit (video or telephone).   CONSENT FOR VIRTUAL VISIT FOR:  Luis Riley  By participating in this virtual visit I agree to the following:  I hereby voluntarily request, consent and authorize [facility] and its employed or contracted physicians, physician assistants, nurse practitioners or other licensed health care professionals (the Practitioner), to provide me with telemedicine health care services (the "Services") as deemed necessary by the treating Practitioner. I acknowledge and consent to receive the Services by the Practitioner via telemedicine. I understand that the telemedicine visit will involve communicating with the Practitioner through live audiovisual communication technology and the disclosure of certain medical information by electronic transmission. I acknowledge that I have been given the opportunity to request an in-person assessment or other available alternative prior to the telemedicine visit and am voluntarily participating in the telemedicine visit.  I understand that I have the right to withhold or withdraw my consent to the use of telemedicine in the course of my care at any time, without affecting my right to future care or treatment, and that the Practitioner or I may terminate the telemedicine visit at any time. I understand that I have the right to inspect all information obtained and/or recorded in the course of the telemedicine visit and may receive copies of available information for a reasonable fee.  I understand that some of the potential risks of receiving the Services via telemedicine include:  Marland Kitchen Delay or interruption in medical evaluation due to technological equipment failure or disruption; . Information transmitted may not be sufficient (e.g. poor resolution of images) to allow for appropriate medical decision making by the Practitioner;  and/or  . In rare instances, security protocols could fail, causing a breach of personal health information.  Furthermore, I acknowledge that it is my responsibility to provide information about my medical history, conditions and care that is complete and accurate to the best of my ability. I acknowledge that Practitioner's advice, recommendations, and/or decision may be based on factors not within their control, such as incomplete or inaccurate data provided by me or distortions of diagnostic images or specimens that may result from electronic transmissions. I understand that the practice of medicine is not an exact science and that Practitioner makes no warranties or guarantees regarding treatment outcomes. I acknowledge that a copy of this consent can be made available to me via my patient portal ([employer] [facility]), or I can request a printed copy by calling the office of [facility].    I understand that my insurance will be billed for this visit.   I have read or had this consent read to me. . I understand the contents of this consent, which adequately explains the benefits and risks of the Services being provided via telemedicine.  . I have been provided ample opportunity to ask questions regarding this consent and the Services and have had my questions answered to my satisfaction. . I give my informed consent for the services to be provided through the use of telemedicine in my medical care

## 2020-06-11 ENCOUNTER — Encounter: Payer: Self-pay | Admitting: Family Medicine

## 2020-06-11 ENCOUNTER — Telehealth (INDEPENDENT_AMBULATORY_CARE_PROVIDER_SITE_OTHER): Payer: Medicare HMO | Admitting: Family Medicine

## 2020-06-11 VITALS — BP 150/73 | HR 75 | Ht 64.0 in | Wt 191.0 lb

## 2020-06-11 DIAGNOSIS — E119 Type 2 diabetes mellitus without complications: Secondary | ICD-10-CM

## 2020-06-11 DIAGNOSIS — Z87891 Personal history of nicotine dependence: Secondary | ICD-10-CM

## 2020-06-11 DIAGNOSIS — I429 Cardiomyopathy, unspecified: Secondary | ICD-10-CM | POA: Diagnosis not present

## 2020-06-11 DIAGNOSIS — N184 Chronic kidney disease, stage 4 (severe): Secondary | ICD-10-CM

## 2020-06-11 DIAGNOSIS — I739 Peripheral vascular disease, unspecified: Secondary | ICD-10-CM | POA: Diagnosis not present

## 2020-06-11 DIAGNOSIS — I10 Essential (primary) hypertension: Secondary | ICD-10-CM | POA: Diagnosis not present

## 2020-06-11 MED ORDER — AMLODIPINE BESYLATE 5 MG PO TABS
5.0000 mg | ORAL_TABLET | Freq: Every day | ORAL | 6 refills | Status: DC
Start: 2020-06-11 — End: 2021-01-28

## 2020-06-11 NOTE — Patient Instructions (Signed)
Medication Instructions:   Increase Amlodipine to 5mg  daily.   Continue all other medications.    Labwork: none  Testing/Procedures: none  Follow-Up: 1 month   Any Other Special Instructions Will Be Listed Below (If Applicable). You have been referred to:  Vascular   If you need a refill on your cardiac medications before your next appointment, please call your pharmacy.

## 2020-07-12 NOTE — Progress Notes (Signed)
Cardiology Office Note  Date: 07/13/2020   ID: Luis Riley, DOB 08/17/1965, MRN [id number]  PCP:  Neale Burly, MD  Cardiologist:  Rozann Lesches, MD Electrophysiologist:  None   Chief Complaint: Follow-up secondary cardiomyopathy  History of Present Illness: Luis Riley is a 54 y.o. male with a history of secondary cardiomyopathy, tobacco abuse, HTN, type 2 diabetes, with diabetic retinopathy of left eye with macular edema, HLD, COPD.  Previous patient of Dr. Court Joy.  Recently established with Dr. Domenic Polite on 04/01/2020.  He had been doing reasonably well and no report of progressive shortness of breath, chest pain, palpitations, syncope.  He was using compression stockings things with reasonable control of lower extremity edema.  Had a recent dobutamine Myoview June 2021 with no obvious evidence of scar or ischemia.  Abnormal ST-T segment changes noted.  LVEF 25 to 30% with global hypokinesis by echocardiogram in May.  His weight was up significantly from June but he remained on Lasix with potassium supplements.  Plan was to repeat echocardiogram for reassessment with a follow-up basic metabolic panel and proceed from there.  Recent telephone encounter with message from Dr. Domenic Polite on 04/02/2020.  His renal function had worsened since his previous check in June.  Creatinine was up from 2.3-3.1 and potassium was 5.3.  He was asked to stop his and Entresto and hold potassium supplements and was to follow-up in 1 week.  He had a follow-up echocardiogram pending.  Dr. Domenic Polite stated this may be cardiorenal syndrome and situation where he needed to be admitted for diuresis and optimization of medical therapy depending on renal function trend and clinical progress.  Presented at a previous 05/07/2020 visit with no particular complaints.  He denied any anginal or exertional symptoms, palpitations or arrhythmias, orthostatic symptoms, CVA or TIA-like symptoms, PND, orthopnea, bleeding,  claudication-like symptoms, DVT or PE-like symptoms, or lower extremity edema.  He was wearing compression stockings .  Blood pressure was elevated on arrival at 140/78. He did not  appear hypervolemic.  He was going to undergo a repeat echocardiogram  in the clinic after being seen that day today.  He had lost approximately 2 pounds since 04/01/2020  Presented last visit 05/08/2020 for 1 month follow-up status post recent echocardiogram.  Ejection fraction is much improved on recent echo.  EF is 50%  Spoke to wife and patient via telemedicine on 06/11/2020. Informed her of lower extremity arterial duplex and ABIs were abnormal.  His blood pressure today was elevated.  Wife stated he was taking his medications as directed. She was wondering if she should increase one of his blood pressure medications.  He had seen Dr. Theador Hawthorne and had a renal ultrasound performed which showed no significant issues.  Dr. Theador Hawthorne wanted him to have a 24-hour urine creatinine clearance study but wife states she was having a hard time obtaining enough urine for samples.  Otherwise he denied any other issues per her statement.  Patient is here for 1 month follow-up.  Blood pressure continues to be elevated at 148/72.  He denies any anginal or exertional symptoms, palpitations or arrhythmias, orthostatic symptoms, CVA or TIA-like symptoms, PND, orthopnea.  Continues with some mild claudication-like symptoms.  He has an upcoming appointment with Dr. Donnetta Hutching on December 13 at 9:50 AM for evaluation for abnormal ABI/lower arterial duplex.   Past Medical History:  Diagnosis Date  . Cardiomyopathy ([place])   . Confusion   . COPD (chronic obstructive pulmonary disease) ([place])   . Diabetic retinopathy  of left eye with macular edema associated with diabetes mellitus due to underlying condition ([place])   . Essential hypertension   . Mixed hyperlipidemia   . Radiculopathy of cervical region   . Type 2 diabetes mellitus ([place])     Past  Surgical History:  Procedure Laterality Date  . CHOLECYSTECTOMY      Current Outpatient Medications  Medication Sig Dispense Refill  . amLODipine (NORVASC) 5 MG tablet Take 1 tablet (5 mg total) by mouth daily. 30 tablet 6  . aspirin EC 81 MG tablet Take 81 mg by mouth daily. Swallow whole.    Marland Kitchen atorvastatin (LIPITOR) 20 MG tablet Take 20 mg by mouth daily.     . Cholecalciferol (VITAMIN D3 PO) Take 2,000 Units by mouth daily.    . dapagliflozin propanediol (FARXIGA) 5 MG TABS tablet Take 5 mg by mouth daily.    . furosemide (LASIX) 20 MG tablet Take 20 mg by mouth daily. Gives another 20mg  tab as needed for weight gain.    Marland Kitchen gabapentin (NEURONTIN) 300 MG capsule Take 300 mg by mouth 3 (three) times daily as needed (for nerve pain).     Marland Kitchen glipiZIDE (GLUCOTROL) 5 MG tablet Take 5 mg by mouth daily before breakfast.     . metoprolol succinate (TOPROL XL) 25 MG 24 hr tablet Take 0.5 tablets (12.5 mg total) by mouth daily. 45 tablet 3   No current facility-administered medications for this visit.   Allergies:  Patient has no known allergies.   Social History: The patient  reports that he has been smoking cigarettes. He has never used smokeless tobacco. He reports previous alcohol use. He reports previous drug use.   Family History: The patient's family history includes Diabetes in his mother; Suicidality in his father.   ROS:  Please see the history of present illness. Otherwise, complete review of systems is positive for none.  All other systems are reviewed and negative.   Physical Exam: VS:  BP (!) 148/72   Pulse 89   Ht 5\' 5"  (1.651 m)   Wt 199 lb (90.3 kg)   SpO2 99%   BMI 33.12 kg/m , BMI Body mass index is 33.12 kg/m.  Wt Readings from Last 3 Encounters:  07/13/20 199 lb (90.3 kg)  06/11/20 191 lb (86.6 kg)  05/08/20 194 lb 6.4 oz (88.2 kg)      General: Patient appears comfortable at rest. Neck: Supple, no elevated JVP or carotid bruits, no thyromegaly. Lungs: Clear  to auscultation, nonlabored breathing at rest. Cardiac: Regular rate and rhythm, no S3 or significant systolic murmur, no pericardial rub. Extremities: No pitting edema, distal pulses 2+. Skin: Warm and dry. Musculoskeletal: No kyphosis. Neuropsychiatric: Alert and oriented x3, affect grossly appropriate.    ECG:  EKG 09/16/2019: Sinus rhythm rate of 86, biatrial enlargement, right axis deviation, consider LVH, anterior Q waves possible due to LVH.  Recent Labwork: 01/30/2020: ALT 23; AST 18; BUN 33; Creatinine, Ser 2.36; Hemoglobin 8.6; Platelets 309; Potassium 5.0; Sodium 144; TSH 2.640  No results found for: CHOL, TRIG, HDL, CHOLHDL, VLDL, LDLCALC, LDLDIRECT  Other Studies Reviewed Today:   Lower arterial duplex and ABI studies 06/04/2020   Summary: Right: Total occlusion noted in the superficial femoral artery. High grade stenosis in the distal end of the right mid SFA with a short segment occlusion in the distal right SFA. Left: Total occlusion noted in the superficial femoral artery. Occluded left mid SFA with reconstitution via collaterals in the distal left  SFA.  Summary: Right: Resting right ankle-brachial index indicates moderate right lower extremity arterial disease. The right toe-brachial index is abnormal. Left: Resting left ankle-brachial index indicates moderate left lower extremity arterial disease. The left toebrachial index is abnormal    Echocardiogram 04/09/2020 1. Left ventricular ejection fraction, by estimation, is approximately 50%. The left ventricle has low normal function. The left ventricle has no regional wall motion abnormalities. Left ventricular diastolic parameters are consistent with Grade II diastolic dysfunction (pseudonormalization). 2. Right ventricular systolic function is normal. The right ventricular size is normal. There is normal pulmonary artery systolic pressure. The estimated right ventricular systolic pressure is 32.6 mmHg. 3. Left  atrial size was mild to moderately dilated. 4. The mitral valve is grossly normal. Mild mitral valve regurgitation. 5. The aortic valve is tricuspid. Aortic valve regurgitation is not visualized. 6. The inferior vena cava is normal in size with greater than 50% respiratory variability, suggesting right atrial pressure of 3 mmHg.   Echocardiogram 12/10/2019: 1. Severe global reduction in LV systolic function; mild LVE; grade 2  diastolic dysfunction.  2. Left ventricular ejection fraction, by estimation, is 25 to 30%. The  left ventricle has severely decreased function. The left ventricle  demonstrates global hypokinesis. The left ventricular internal cavity size  was mildly dilated. Left ventricular  diastolic parameters are consistent with Grade II diastolic dysfunction  (pseudonormalization). Elevated left atrial pressure.  3. Right ventricular systolic function is normal. The right ventricular  size is normal.  4. The mitral valve is normal in structure. Trivial mitral valve  regurgitation. No evidence of mitral stenosis.  5. The aortic valve is tricuspid. Aortic valve regurgitation is not  visualized. No aortic stenosis is present.  6. The inferior vena cava is normal in size with greater than 50%  respiratory variability, suggesting right atrial pressure of 3 mmHg.   Dobutamine echocardiogram 01/27/2020:  The study is normal. There are no perfusion defects consistent with prior infarct or current ischemia.  This is a high risk study. Risk is based on decreased LVEF, there is no evidence of prior infarction or current ischemia. Consider correlating LVEF with echo.  The left ventricular ejection fraction is moderately decreased (31%).  Horizontal ST segment depression ST segment depression of 1 mm was noted during stress in the II, III and aVF leads.   Assessment and Plan:  1.  Combined systolic and diastolic heart failure Previously stopped Entresto  due to increasing  creatinine from 2.3-3.1 on recent lab work.  Recent repeat echo: Improvement of EF from previous echo in May with EF of 25 to 30% to more recent EF 50% on echocardiogram 04/09/2020 with G2 DD, LA mild to moderately dilated, mild MR. Recent lab work from 05/07/2020 showed glucose 67, BUN 40, creatinine 2.77, GFR 29, sodium 142, potassium 4.8, calcium 9.3.  Increasing Toprol-XL to 25 mg for blood pressure control as well as GDMT for combined heart failure.  Continue Lasix 20 mg daily and another 20 mg as needed for weight gain.  Patient has gained some weight recently previous weight on 06/11/2020 was 191.  Today's weight 199.  No evidence of volume overload.  No lower extremity edema.  No shortness of breath.  2. History of tobacco abuse History of tobacco abuse and COPD.  Continuing to smoke.  Advised him to stop.  3. Essential hypertension Blood pressure is elevated today at 148/72.  Continue amlodipine to 5 mg p.o. daily.  Increase Toprol XL to 25 mg.  4.  Possible cardiorenal syndrome.  Entresto and potassium supplements had been held and will likely not be renewed due to decreased renal function.  Recent lab work collected on 04/01/2020 showed a creatinine of 3.14 and GFR of 25.  More recent creatinine 2.77, GFR 29, potassium 4.8, on 05/07/2020.  Recently saw Dr. Theador Hawthorne.  Dr. Theador Hawthorne wanted to do a 24-hour creatinine clearance study.  Recently had renal ultrasound which showed no significant issues.  He has an upcoming follow-up with Dr. Theador Hawthorne.  Wife states she has been having a hard time collecting urine for creatinine clearance and awaiting order for condom cath to continue collecting urine for this study.  5.  Claudication/leg pain/PAD Patient had been walking with his wife and having calf pain which worsened when walking and is relieved at rest.  Patient recently had abnormal lower extremity arterial duplex and ABIs.  He has an upcoming appointment with Dr. Donnetta Hutching with vascular December 13 at 9:50  AM.  6.  Hyperlipidemia Continue atorvastatin 20 mg p.o. daily.  Recent lipid panel: Total cholesterol 106, triglycerides 76, HDL 49, LDL 41.  7.  Diabetes type 2. Recent hemoglobin A1c 7.3% follows with PCP for diabetes.  Medication Adjustments/Labs and Tests Ordered: Current medicines are reviewed at length with the patient today.  Concerns regarding medicines are outlined above.      Disposition: Follow-up with Dr. Domenic Polite or APP 3 months  Signed, Levell July, NP 07/13/2020 10:07 AM    [facility], [place], [place] [address] Phone: [phone]; Fax: [phone]

## 2020-07-13 ENCOUNTER — Ambulatory Visit (INDEPENDENT_AMBULATORY_CARE_PROVIDER_SITE_OTHER): Payer: Medicare HMO | Admitting: Family Medicine

## 2020-07-13 ENCOUNTER — Encounter: Payer: Self-pay | Admitting: Family Medicine

## 2020-07-13 VITALS — BP 148/72 | HR 89 | Ht 65.0 in | Wt 199.0 lb

## 2020-07-13 DIAGNOSIS — I5042 Chronic combined systolic (congestive) and diastolic (congestive) heart failure: Secondary | ICD-10-CM | POA: Diagnosis not present

## 2020-07-13 DIAGNOSIS — E782 Mixed hyperlipidemia: Secondary | ICD-10-CM | POA: Diagnosis not present

## 2020-07-13 DIAGNOSIS — I739 Peripheral vascular disease, unspecified: Secondary | ICD-10-CM | POA: Diagnosis not present

## 2020-07-13 DIAGNOSIS — I10 Essential (primary) hypertension: Secondary | ICD-10-CM

## 2020-07-13 DIAGNOSIS — E119 Type 2 diabetes mellitus without complications: Secondary | ICD-10-CM

## 2020-07-13 DIAGNOSIS — Z87891 Personal history of nicotine dependence: Secondary | ICD-10-CM | POA: Diagnosis not present

## 2020-07-13 MED ORDER — METOPROLOL SUCCINATE ER 25 MG PO TB24: 25.0000 mg | ORAL_TABLET | Freq: Every day | ORAL | 3 refills | Status: AC

## 2020-07-13 NOTE — Patient Instructions (Signed)
Medication Instructions:   Your physician has recommended you make the following change in your medication:   Increase metoprolol succinate to 25 mg by mouth daily  Continue other medications the same  Labwork:  None  Testing/Procedures:  None  Follow-Up:  Your physician recommends that you schedule a follow-up appointment in: 3 months  Any Other Special Instructions Will Be Listed Below (If Applicable).  If you need a refill on your cardiac medications before your next appointment, please call your pharmacy.

## 2020-07-20 ENCOUNTER — Encounter: Payer: Self-pay | Admitting: Vascular Surgery

## 2020-07-20 ENCOUNTER — Ambulatory Visit (INDEPENDENT_AMBULATORY_CARE_PROVIDER_SITE_OTHER): Payer: Medicare HMO | Admitting: Vascular Surgery

## 2020-07-20 ENCOUNTER — Other Ambulatory Visit: Payer: Self-pay

## 2020-07-20 VITALS — BP 145/84 | HR 71 | Temp 98.4°F | Resp 12 | Ht 65.0 in | Wt 199.0 lb

## 2020-07-20 DIAGNOSIS — I70213 Atherosclerosis of native arteries of extremities with intermittent claudication, bilateral legs: Secondary | ICD-10-CM | POA: Diagnosis not present

## 2020-07-20 NOTE — Progress Notes (Signed)
[facility] [place]  Patient name: Luis Riley MRN: [id number] DOB: 03/09/1966 Sex: male  REASON FOR CONSULT: Evaluation lower extremity arterial insufficiency  HPI: Luis Riley is a 54 y.o. male, here today for evaluation of lower extremity arterial insufficiency.  His wife provides the majority of his history and is here with him as well.  His main complaint is of intermittent claudication.  This is mainly in his calves but can affect his buttocks as well.  He has no history of lower extremity tissue loss.  No arterial rest pain.  He does have a history of cardiac disease with congestive heart failure and cardiomyopathy.  Also is a diabetic and does smoke cigarettes.  He also has a history of renal insufficiency.  Past Medical History:  Diagnosis Date  . Cardiomyopathy ([place])   . Confusion   . COPD (chronic obstructive pulmonary disease) ([facility])   . Diabetic retinopathy of left eye with macular edema associated with diabetes mellitus due to underlying condition ([place])   . Essential hypertension   . Mixed hyperlipidemia   . Radiculopathy of cervical region   . Type 2 diabetes mellitus ([facility])     Family History  Problem Relation Age of Onset  . Diabetes Mother   . Suicidality Father     SOCIAL HISTORY: Social History   Socioeconomic History  . Marital status: Married    Spouse name: Not on file  . Number of children: 4  . Years of education: 11th grade  . Highest education level: Not on file  Occupational History  . Occupation: Disabled  Tobacco Use  . Smoking status: Current Some Day Smoker    Types: Cigarettes  . Smokeless tobacco: Never Used  . Tobacco comment: "cigarette every now and then"  Vaping Use  . Vaping Use: Never used  Substance and Sexual Activity  . Alcohol use: Not Currently    Comment: previously heavy drinker - "not in a couple of year"  . Drug use: Not Currently    Comment: previously used marijuana - "not since 06/2019"   . Sexual activity: Not on file  Other Topics Concern  . Not on file  Social History Narrative   Lives with his wife.   Right-handed.   Caffeine use: 2 cups coffee, 3 cups soda per day.   Social Determinants of Health   Financial Resource Strain: Not on file  Food Insecurity: Not on file  Transportation Needs: Not on file  Physical Activity: Not on file  Stress: Not on file  Social Connections: Not on file  Intimate Partner Violence: Not on file    No Known Allergies  Current Outpatient Medications  Medication Sig Dispense Refill  . amLODipine (NORVASC) 5 MG tablet Take 1 tablet (5 mg total) by mouth daily. 30 tablet 6  . aspirin EC 81 MG tablet Take 81 mg by mouth daily. Swallow whole.    Marland Kitchen atorvastatin (LIPITOR) 20 MG tablet Take 20 mg by mouth daily.     . Cholecalciferol (VITAMIN D3 PO) Take 2,000 Units by mouth daily.    . dapagliflozin propanediol (FARXIGA) 5 MG TABS tablet Take 5 mg by mouth daily.    . furosemide (LASIX) 20 MG tablet Take 20 mg by mouth daily. Gives another 20mg  tab as needed for weight gain.    Marland Kitchen gabapentin (NEURONTIN) 300 MG capsule Take 300 mg by mouth 3 (three) times daily as needed (for nerve pain).     Marland Kitchen glipiZIDE (GLUCOTROL)  5 MG tablet Take 5 mg by mouth daily before breakfast.     . metoprolol succinate (TOPROL XL) 25 MG 24 hr tablet Take 1 tablet (25 mg total) by mouth daily. 90 tablet 3   No current facility-administered medications for this visit.    REVIEW OF SYSTEMS:  [X]  denotes positive finding, [ ]  denotes negative finding Cardiac  Comments:  Chest pain or chest pressure:    Shortness of breath upon exertion:    Short of breath when lying flat:    Irregular heart rhythm:        Vascular    Pain in calf, thigh, or hip brought on by ambulation: x   Pain in feet at night that wakes you up from your sleep:     Blood clot in your veins:    Leg swelling:         Pulmonary    Oxygen at home:    Productive cough:     Wheezing:          Neurologic    Sudden weakness in arms or legs:     Sudden numbness in arms or legs:     Sudden onset of difficulty speaking or slurred speech:    Temporary loss of vision in one eye:     Problems with dizziness:         Gastrointestinal    Blood in stool:     Vomited blood:         Genitourinary    Burning when urinating:     Blood in urine:        Psychiatric    Major depression:         Hematologic    Bleeding problems:    Problems with blood clotting too easily:        Skin    Rashes or ulcers:        Constitutional    Fever or chills:      PHYSICAL EXAM: Vitals:   07/20/20 0959  BP: (!) 145/84  Pulse: 71  Resp: 12  Temp: 98.4 F (36.9 C)  TempSrc: Other (Comment)  SpO2: 98%  Weight: 199 lb (90.3 kg)  Height: 5\' 5"  (1.651 m)    GENERAL: The patient is a well-nourished male, in no acute distress. The vital signs are documented above. VASCULAR: Carotid arteries without bruits bilaterally.  2+ radial and 2+ femoral pulses bilaterally.  He has absent popliteal and distal pulses bilaterally. PULMONARY: There is good air exchange ABDOMEN: Soft and non-tender  MUSCULOSKELETAL: There are no major deformities or cyanosis. NEUROLOGIC: No focal weakness or paresthesias are detected. SKIN: There are no ulcers or rashes noted. PSYCHIATRIC: The patient has a normal affect.  DATA:   Noninvasive studies from October 2021 were reviewed.  This reveals ankle arm index of 0.60 on the right and 0.68 on the left.  Ultrasound reveals occlusion of his superficial femoral arteries bilaterally.  MEDICAL ISSUES:  Had long discussion with the patient and his wife.  He does have calf claudication related to his superficial femoral occlusive disease.  I explained that this is not limb threatening.  It is unclear as to whether his buttock discomfort is related to arterial insufficiency or other cause potentially degenerative disc disease.  This is moderately limiting to him.  He  is trying to walk for exercise and has difficulty doing this secondary to the discomfort.  He does have chronic renal insufficiency.  Most recent creatinine in our system is  2.6.  I explained my concern that either CT angiogram or catheter-based arteriography would potentially put his kidneys at risk.  With him having normal femoral pulses, this is all most likely related to SFA disease.  I did explain that he could have stenosis with normal femoral pulses and may be possibly treated with iliac angioplasty if this was the case.  Unfortunately this would require iodinated contrast for imaging and I feel that this is prohibitive risk.  He is able to tolerate his current level of walking.  I explained that if this became limb threatening, we would certainly proceed with arteriography and accept the renal risk.  I explained that if this is all related to superficial femoral artery occlusive disease that endovascular treatment for this is very poor long-term durability and certainly would not recommend bilateral staged femoral to popliteal bypasses with his current level of disability.  The patient and his wife are in agreement with this conservative approach.  He will continue his walking program.  I again stressed the importance of smoking cessation explained that he would be able to walk further if he would be able to quit smoking.  His wife will continue to keep a close eye on his feet and notify should he develop any tissue loss.  Otherwise we will see him again on an as-needed basis   Rosetta Posner, MD [facility] [place] Office phone [phone]

## 2020-07-28 ENCOUNTER — Ambulatory Visit: Payer: Medicare HMO | Admitting: Neurology

## 2020-08-05 DIAGNOSIS — E785 Hyperlipidemia, unspecified: Secondary | ICD-10-CM | POA: Diagnosis not present

## 2020-08-05 DIAGNOSIS — F0151 Vascular dementia with behavioral disturbance: Secondary | ICD-10-CM | POA: Diagnosis not present

## 2020-08-05 DIAGNOSIS — N3942 Incontinence without sensory awareness: Secondary | ICD-10-CM | POA: Diagnosis not present

## 2020-08-05 DIAGNOSIS — I5022 Chronic systolic (congestive) heart failure: Secondary | ICD-10-CM | POA: Diagnosis not present

## 2020-08-05 DIAGNOSIS — I10 Essential (primary) hypertension: Secondary | ICD-10-CM | POA: Diagnosis not present

## 2020-08-05 DIAGNOSIS — Z6835 Body mass index (BMI) 35.0-35.9, adult: Secondary | ICD-10-CM | POA: Diagnosis not present

## 2020-08-05 DIAGNOSIS — E1121 Type 2 diabetes mellitus with diabetic nephropathy: Secondary | ICD-10-CM | POA: Diagnosis not present

## 2020-08-05 DIAGNOSIS — N184 Chronic kidney disease, stage 4 (severe): Secondary | ICD-10-CM | POA: Diagnosis not present

## 2020-08-26 ENCOUNTER — Telehealth: Payer: Self-pay | Admitting: Cardiology

## 2020-08-26 NOTE — Telephone Encounter (Signed)
Needing a new Rx for his metoprolol succinate (TOPROL XL) 25 MG 24 hr tablet [id number]  He's out and [facility] is telling them they cannot refill it till February.

## 2020-08-26 NOTE — Telephone Encounter (Signed)
Spoke with pharmacy at [facility] - states that they were calling in refill from old bottle.  Pharm does have new prescription for the whole pill on the Toprol XL '25mg'$  daily dated 07/13/2020.  He will get it ready for the patient.

## 2020-09-15 DIAGNOSIS — R2689 Other abnormalities of gait and mobility: Secondary | ICD-10-CM | POA: Diagnosis not present

## 2020-10-27 ENCOUNTER — Ambulatory Visit (INDEPENDENT_AMBULATORY_CARE_PROVIDER_SITE_OTHER): Payer: Medicare HMO | Admitting: Cardiology

## 2020-10-27 ENCOUNTER — Encounter: Payer: Self-pay | Admitting: Cardiology

## 2020-10-27 VITALS — BP 144/70 | HR 64 | Wt 206.8 lb

## 2020-10-27 DIAGNOSIS — I429 Cardiomyopathy, unspecified: Secondary | ICD-10-CM

## 2020-10-27 DIAGNOSIS — J449 Chronic obstructive pulmonary disease, unspecified: Secondary | ICD-10-CM

## 2020-10-27 DIAGNOSIS — N184 Chronic kidney disease, stage 4 (severe): Secondary | ICD-10-CM

## 2020-10-27 NOTE — Patient Instructions (Addendum)

## 2020-10-27 NOTE — Progress Notes (Signed)
Cardiology Office Note  Date: 10/27/2020   ID: [name], DOB 1966-05-20, MRN [id number]  PCP:  Neale Burly, MD  Cardiologist:  Rozann Lesches, MD Electrophysiologist:  None   Chief Complaint  Patient presents with  . Cardiac follow-up    History of Present Illness: Luis Riley is a 55 y.o. male last seen in December 2021 by Mr. Leonides Sake NP.  He presents for a follow-up visit.  States that lower extremity swelling has generally been much better controlled.  No chest pain or increasing shortness of breath with typical ADLs.  Patient came off of Entresto and potassium supplements due to progressive renal insufficiency and hyperkalemia.  Last follow-up lab work is noted below.  He saw Dr. Theador Hawthorne for nephrology evaluation in October 2021.  Patient has CKD stage IV at baseline.  He has pending lab work next week with Dr. Sherrie Sport.  Echocardiogram from September 2021 revealed LVEF approximately 50% with moderate diastolic dysfunction, normal RV contraction.  This was a significant improvement of her prior assessment.  Current cardiac regimen includes aspirin, Lipitor, Norvasc, Toprol-XL, and Lasix.  I personally reviewed his ECG today which shows normal sinus rhythm with left atrial enlargement.  Past Medical History:  Diagnosis Date  . Cardiomyopathy ([place])   . Confusion   . COPD (chronic obstructive pulmonary disease) ([place])   . Diabetic retinopathy of left eye with macular edema associated with diabetes mellitus due to underlying condition ([place])   . Essential hypertension   . Mixed hyperlipidemia   . Radiculopathy of cervical region   . Type 2 diabetes mellitus ([place])     Past Surgical History:  Procedure Laterality Date  . CHOLECYSTECTOMY      Current Outpatient Medications  Medication Sig Dispense Refill  . amLODipine (NORVASC) 5 MG tablet Take 1 tablet (5 mg total) by mouth daily. 30 tablet 6  . aspirin EC 81 MG tablet Take 81 mg by mouth daily. Swallow whole.    Marland Kitchen  atorvastatin (LIPITOR) 20 MG tablet Take 20 mg by mouth daily.     . Cholecalciferol (VITAMIN D3 PO) Take 2,000 Units by mouth daily.    . dapagliflozin propanediol (FARXIGA) 5 MG TABS tablet Take 5 mg by mouth daily.    . furosemide (LASIX) 20 MG tablet Take 20 mg by mouth daily. Gives another '20mg'$  tab as needed for weight gain.    Marland Kitchen gabapentin (NEURONTIN) 300 MG capsule Take 300 mg by mouth 3 (three) times daily as needed (for nerve pain).     Marland Kitchen glipiZIDE (GLUCOTROL) 5 MG tablet Take 5 mg by mouth daily before breakfast.     . metoprolol succinate (TOPROL XL) 25 MG 24 hr tablet Take 1 tablet (25 mg total) by mouth daily. 90 tablet 3   No current facility-administered medications for this visit.   Allergies:  Patient has no known allergies.   ROS: No syncope.  Physical Exam: VS:  BP (!) 144/70 (BP Location: Right Arm, Patient Position: Sitting, Cuff Size: Normal)   Pulse 64   Wt 206 lb 12.8 oz (93.8 kg)   SpO2 99%   BMI 34.41 kg/m , BMI Body mass index is 34.41 kg/m.  Wt Readings from Last 3 Encounters:  10/27/20 206 lb 12.8 oz (93.8 kg)  07/20/20 199 lb (90.3 kg)  07/13/20 199 lb (90.3 kg)    General: Patient appears comfortable at rest. HEENT: Conjunctiva and lids normal, wearing a mask. Neck: Supple, no elevated JVP or carotid bruits, no thyromegaly.  Lungs: Clear to auscultation, nonlabored breathing at rest. Cardiac: Regular rate and rhythm, no S3, soft systolic murmur, no pericardial rub. Extremities: Mild lower leg edema.  ECG:  An ECG dated 09/16/2019 was personally reviewed today and demonstrated:  Sinus rhythm with biatrial enlargement, poor R wave progression.  Recent Labwork: 01/30/2020: ALT 23; AST 18; BUN 33; Creatinine, Ser 2.36; Hemoglobin 8.6; Platelets 309; Potassium 5.0; Sodium 144; TSH 2.640  September 2021: Cholesterol 106, triglycerides 76, HDL 49, LDL 41, BUN 40, creatinine 2.77, potassium 4.8, hemoglobin A1c is 7.3%  Other Studies Reviewed  Today:  Echocardiogram 04/09/2020: 1. Left ventricular ejection fraction, by estimation, is approximately  50%. The left ventricle has low normal function. The left ventricle has no  regional wall motion abnormalities. Left ventricular diastolic parameters  are consistent with Grade II  diastolic dysfunction (pseudonormalization).  2. Right ventricular systolic function is normal. The right ventricular  size is normal. There is normal pulmonary artery systolic pressure. The  estimated right ventricular systolic pressure is 0000000 mmHg.  3. Left atrial size was mild to moderately dilated.  4. The mitral valve is grossly normal. Mild mitral valve regurgitation.  5. The aortic valve is tricuspid. Aortic valve regurgitation is not  visualized.  6. The inferior vena cava is normal in size with greater than 50%  respiratory variability, suggesting right atrial pressure of 3 mmHg.   Assessment and Plan:  1.  History of cardiomyopathy with improvement in LVEF to approximately 50% by most recent echocardiogram in September 2021.  At this point nonischemic etiology is suspected.  He had to come off of Entresto and potassium supplements due to progressive renal insufficiency and hyperkalemia.  For now would continue aspirin, Norvasc, Lipitor, Toprol-XL, Farxiga, and Lasix.  Follow-up with pending lab work per PCP next week.  2.  CKD stage IV, encouraged follow-up with Dr. Theador Hawthorne.  3.  History of tobacco use and COPD.  Medication Adjustments/Labs and Tests Ordered: Current medicines are reviewed at length with the patient today.  Concerns regarding medicines are outlined above.   Tests Ordered: Orders Placed This Encounter  Procedures  . EKG 12-Lead    Medication Changes: No orders of the defined types were placed in this encounter.   Disposition:  Follow up 6 months in the [place] office.  Signed, Satira Sark, MD, [facility] 10/27/2020 11:34 AM    [facility]  at [place], [place], [place] [address] Phone: [phone]; Fax: [phone]

## 2020-11-03 DIAGNOSIS — I5022 Chronic systolic (congestive) heart failure: Secondary | ICD-10-CM | POA: Diagnosis not present

## 2020-11-03 DIAGNOSIS — N3942 Incontinence without sensory awareness: Secondary | ICD-10-CM | POA: Diagnosis not present

## 2020-11-03 DIAGNOSIS — E785 Hyperlipidemia, unspecified: Secondary | ICD-10-CM | POA: Diagnosis not present

## 2020-11-03 DIAGNOSIS — I10 Essential (primary) hypertension: Secondary | ICD-10-CM | POA: Diagnosis not present

## 2020-11-03 DIAGNOSIS — F0151 Vascular dementia with behavioral disturbance: Secondary | ICD-10-CM | POA: Diagnosis not present

## 2020-11-03 DIAGNOSIS — N184 Chronic kidney disease, stage 4 (severe): Secondary | ICD-10-CM | POA: Diagnosis not present

## 2020-11-03 DIAGNOSIS — E1121 Type 2 diabetes mellitus with diabetic nephropathy: Secondary | ICD-10-CM | POA: Diagnosis not present

## 2020-11-03 DIAGNOSIS — Z6835 Body mass index (BMI) 35.0-35.9, adult: Secondary | ICD-10-CM | POA: Diagnosis not present

## 2020-11-03 DIAGNOSIS — Z0000 Encounter for general adult medical examination without abnormal findings: Secondary | ICD-10-CM | POA: Diagnosis not present

## 2020-11-06 ENCOUNTER — Encounter (INDEPENDENT_AMBULATORY_CARE_PROVIDER_SITE_OTHER): Payer: Self-pay | Admitting: *Deleted

## 2020-11-06 DIAGNOSIS — I5022 Chronic systolic (congestive) heart failure: Secondary | ICD-10-CM | POA: Diagnosis not present

## 2020-11-06 DIAGNOSIS — I10 Essential (primary) hypertension: Secondary | ICD-10-CM | POA: Diagnosis not present

## 2020-11-06 DIAGNOSIS — E1121 Type 2 diabetes mellitus with diabetic nephropathy: Secondary | ICD-10-CM | POA: Diagnosis not present

## 2020-11-06 DIAGNOSIS — Z0000 Encounter for general adult medical examination without abnormal findings: Secondary | ICD-10-CM | POA: Diagnosis not present

## 2020-11-06 DIAGNOSIS — Z125 Encounter for screening for malignant neoplasm of prostate: Secondary | ICD-10-CM | POA: Diagnosis not present

## 2020-11-06 DIAGNOSIS — N184 Chronic kidney disease, stage 4 (severe): Secondary | ICD-10-CM | POA: Diagnosis not present

## 2020-11-06 DIAGNOSIS — E785 Hyperlipidemia, unspecified: Secondary | ICD-10-CM | POA: Diagnosis not present

## 2020-11-10 DIAGNOSIS — Z6836 Body mass index (BMI) 36.0-36.9, adult: Secondary | ICD-10-CM | POA: Diagnosis not present

## 2020-11-10 DIAGNOSIS — N4821 Abscess of corpus cavernosum and penis: Secondary | ICD-10-CM | POA: Diagnosis not present

## 2020-12-28 ENCOUNTER — Other Ambulatory Visit: Payer: Self-pay | Admitting: *Deleted

## 2020-12-28 MED ORDER — FUROSEMIDE 20 MG PO TABS: 20.0000 mg | ORAL_TABLET | Freq: Every day | ORAL | 1 refills | Status: DC

## 2021-01-28 ENCOUNTER — Telehealth: Payer: Self-pay | Admitting: Cardiology

## 2021-01-28 MED ORDER — AMLODIPINE BESYLATE 5 MG PO TABS: 5.0000 mg | ORAL_TABLET | Freq: Every day | ORAL | 6 refills | Status: AC

## 2021-01-28 NOTE — Telephone Encounter (Signed)
Medication refill request approved for Norvasc 5 mg tablets and sent to [facility] per pt request.

## 2021-01-28 NOTE — Telephone Encounter (Signed)
*  STAT* If patient is at the pharmacy, call can be transferred to refill team.   1. Which medications need to be refilled? (please list name of each medication and dose if known) amLODipine (NORVASC) 5 MG tablet  2. Which pharmacy/location (including street and city if local pharmacy) is medication to be sent to? [facility] in [place]  3. Do they need a 30 day or 90 day supply? Luis Riley

## 2021-02-03 DIAGNOSIS — Z6836 Body mass index (BMI) 36.0-36.9, adult: Secondary | ICD-10-CM | POA: Diagnosis not present

## 2021-02-03 DIAGNOSIS — N4821 Abscess of corpus cavernosum and penis: Secondary | ICD-10-CM | POA: Diagnosis not present

## 2021-02-04 DIAGNOSIS — E785 Hyperlipidemia, unspecified: Secondary | ICD-10-CM | POA: Diagnosis not present

## 2021-02-04 DIAGNOSIS — N184 Chronic kidney disease, stage 4 (severe): Secondary | ICD-10-CM | POA: Diagnosis not present

## 2021-02-04 DIAGNOSIS — I10 Essential (primary) hypertension: Secondary | ICD-10-CM | POA: Diagnosis not present

## 2021-02-04 DIAGNOSIS — E1121 Type 2 diabetes mellitus with diabetic nephropathy: Secondary | ICD-10-CM | POA: Diagnosis not present

## 2021-02-05 DIAGNOSIS — N184 Chronic kidney disease, stage 4 (severe): Secondary | ICD-10-CM | POA: Diagnosis not present

## 2021-02-05 DIAGNOSIS — I10 Essential (primary) hypertension: Secondary | ICD-10-CM | POA: Diagnosis not present

## 2021-02-05 DIAGNOSIS — E1121 Type 2 diabetes mellitus with diabetic nephropathy: Secondary | ICD-10-CM | POA: Diagnosis not present

## 2021-03-07 DIAGNOSIS — I10 Essential (primary) hypertension: Secondary | ICD-10-CM | POA: Diagnosis not present

## 2021-03-07 DIAGNOSIS — E1121 Type 2 diabetes mellitus with diabetic nephropathy: Secondary | ICD-10-CM | POA: Diagnosis not present

## 2021-03-07 DIAGNOSIS — N184 Chronic kidney disease, stage 4 (severe): Secondary | ICD-10-CM | POA: Diagnosis not present

## 2021-04-07 DIAGNOSIS — E7849 Other hyperlipidemia: Secondary | ICD-10-CM | POA: Diagnosis not present

## 2021-04-07 DIAGNOSIS — I10 Essential (primary) hypertension: Secondary | ICD-10-CM | POA: Diagnosis not present

## 2021-04-07 DIAGNOSIS — E1121 Type 2 diabetes mellitus with diabetic nephropathy: Secondary | ICD-10-CM | POA: Diagnosis not present

## 2021-04-12 DIAGNOSIS — I10 Essential (primary) hypertension: Secondary | ICD-10-CM | POA: Diagnosis not present

## 2021-04-13 DIAGNOSIS — I10 Essential (primary) hypertension: Secondary | ICD-10-CM | POA: Diagnosis not present

## 2021-04-14 DIAGNOSIS — I10 Essential (primary) hypertension: Secondary | ICD-10-CM | POA: Diagnosis not present

## 2021-04-15 DIAGNOSIS — I10 Essential (primary) hypertension: Secondary | ICD-10-CM | POA: Diagnosis not present

## 2021-04-16 DIAGNOSIS — I10 Essential (primary) hypertension: Secondary | ICD-10-CM | POA: Diagnosis not present

## 2021-04-17 DIAGNOSIS — I10 Essential (primary) hypertension: Secondary | ICD-10-CM | POA: Diagnosis not present

## 2021-04-18 DIAGNOSIS — I10 Essential (primary) hypertension: Secondary | ICD-10-CM | POA: Diagnosis not present

## 2021-04-19 DIAGNOSIS — I10 Essential (primary) hypertension: Secondary | ICD-10-CM | POA: Diagnosis not present

## 2021-04-20 DIAGNOSIS — I10 Essential (primary) hypertension: Secondary | ICD-10-CM | POA: Diagnosis not present

## 2021-04-21 DIAGNOSIS — I10 Essential (primary) hypertension: Secondary | ICD-10-CM | POA: Diagnosis not present

## 2021-04-22 DIAGNOSIS — I10 Essential (primary) hypertension: Secondary | ICD-10-CM | POA: Diagnosis not present

## 2021-04-23 DIAGNOSIS — I10 Essential (primary) hypertension: Secondary | ICD-10-CM | POA: Diagnosis not present

## 2021-04-24 DIAGNOSIS — I10 Essential (primary) hypertension: Secondary | ICD-10-CM | POA: Diagnosis not present

## 2021-04-25 DIAGNOSIS — I10 Essential (primary) hypertension: Secondary | ICD-10-CM | POA: Diagnosis not present

## 2021-05-10 DIAGNOSIS — Z0000 Encounter for general adult medical examination without abnormal findings: Secondary | ICD-10-CM | POA: Diagnosis not present

## 2021-05-10 DIAGNOSIS — I10 Essential (primary) hypertension: Secondary | ICD-10-CM | POA: Diagnosis not present

## 2021-05-10 DIAGNOSIS — E1142 Type 2 diabetes mellitus with diabetic polyneuropathy: Secondary | ICD-10-CM | POA: Diagnosis not present

## 2021-05-10 DIAGNOSIS — I5042 Chronic combined systolic (congestive) and diastolic (congestive) heart failure: Secondary | ICD-10-CM | POA: Diagnosis not present

## 2021-05-10 DIAGNOSIS — R69 Illness, unspecified: Secondary | ICD-10-CM | POA: Diagnosis not present

## 2021-05-10 DIAGNOSIS — E7849 Other hyperlipidemia: Secondary | ICD-10-CM | POA: Diagnosis not present

## 2021-05-10 DIAGNOSIS — Z6838 Body mass index (BMI) 38.0-38.9, adult: Secondary | ICD-10-CM | POA: Diagnosis not present

## 2021-05-14 DIAGNOSIS — I5042 Chronic combined systolic (congestive) and diastolic (congestive) heart failure: Secondary | ICD-10-CM | POA: Diagnosis not present

## 2021-05-14 DIAGNOSIS — E1142 Type 2 diabetes mellitus with diabetic polyneuropathy: Secondary | ICD-10-CM | POA: Diagnosis not present

## 2021-05-14 DIAGNOSIS — Z0000 Encounter for general adult medical examination without abnormal findings: Secondary | ICD-10-CM | POA: Diagnosis not present

## 2021-05-14 DIAGNOSIS — R69 Illness, unspecified: Secondary | ICD-10-CM | POA: Diagnosis not present

## 2021-05-14 DIAGNOSIS — F01518 Vascular dementia, unspecified severity, with other behavioral disturbance: Secondary | ICD-10-CM | POA: Diagnosis not present

## 2021-05-14 DIAGNOSIS — E7849 Other hyperlipidemia: Secondary | ICD-10-CM | POA: Diagnosis not present

## 2021-05-14 DIAGNOSIS — I10 Essential (primary) hypertension: Secondary | ICD-10-CM | POA: Diagnosis not present

## 2021-05-18 DIAGNOSIS — E215 Disorder of parathyroid gland, unspecified: Secondary | ICD-10-CM | POA: Diagnosis not present

## 2021-06-02 ENCOUNTER — Ambulatory Visit (INDEPENDENT_AMBULATORY_CARE_PROVIDER_SITE_OTHER): Payer: Medicare HMO | Admitting: Cardiology

## 2021-06-02 ENCOUNTER — Encounter: Payer: Self-pay | Admitting: Cardiology

## 2021-06-02 ENCOUNTER — Other Ambulatory Visit: Payer: Self-pay

## 2021-06-02 VITALS — BP 128/76 | HR 67 | Ht 65.0 in | Wt 222.0 lb

## 2021-06-02 DIAGNOSIS — I429 Cardiomyopathy, unspecified: Secondary | ICD-10-CM

## 2021-06-02 DIAGNOSIS — N184 Chronic kidney disease, stage 4 (severe): Secondary | ICD-10-CM

## 2021-06-02 NOTE — Progress Notes (Signed)
Cardiology Office Note  Date: 06/02/2021   ID: Luis Riley, DOB 21-Dec-1965, MRN [id number]  PCP:  Neale Burly, MD  Cardiologist:  Rozann Lesches, MD Electrophysiologist:  None   Chief Complaint  Patient presents with   Cardiac follow-up    History of Present Illness: Luis Riley is a 55 y.o. male last seen in March.  He is here today with his wife for a follow-up visit.  He states that he has been doing reasonably well, no chest pain, NYHA class II dyspnea.  Reportedly, his hemoglobin A1c has come down from 9% to 7% most recently.  I went over his medications which have been modified since last encounter.  He is no longer on Glucotrol or [place] with progressive renal dysfunction.  Last echocardiogram was in September 2021 at which point LVEF was approximately 50% with moderate diastolic dysfunction.  Past Medical History:  Diagnosis Date   Cardiomyopathy ([place])    Confusion    COPD (chronic obstructive pulmonary disease) ([facility])    Diabetic retinopathy of left eye with macular edema associated with diabetes mellitus due to underlying condition ([facility])    Essential hypertension    Mixed hyperlipidemia    Radiculopathy of cervical region    Type 2 diabetes mellitus ([facility])     Past Surgical History:  Procedure Laterality Date   CHOLECYSTECTOMY      Current Outpatient Medications  Medication Sig Dispense Refill   amLODipine (NORVASC) 5 MG tablet Take 1 tablet (5 mg total) by mouth daily. 30 tablet 6   aspirin EC 81 MG tablet Take 81 mg by mouth daily. Swallow whole.     atorvastatin (LIPITOR) 20 MG tablet Take 20 mg by mouth daily.      Cholecalciferol (VITAMIN D3 PO) Take 2,000 Units by mouth daily.     cinacalcet (SENSIPAR) 30 MG tablet Take 30 mg by mouth daily.     furosemide (LASIX) 20 MG tablet Take 1 tablet (20 mg total) by mouth daily. Gives another 20mg  tab as needed for weight gain. 120 tablet 1   metoprolol succinate (TOPROL XL) 25 MG 24 hr tablet Take 1  tablet (25 mg total) by mouth daily. 90 tablet 3   NOVOLOG FLEXPEN 100 UNIT/ML FlexPen 12 Units 3 (three) times daily.     sodium bicarbonate 650 MG tablet Take 650 mg by mouth 2 (two) times daily.     No current facility-administered medications for this visit.   Allergies:  Patient has no known allergies.   ROS: No palpitations or syncope.  Physical Exam: VS:  BP 128/76   Pulse 67   Ht 5\' 5"  (1.651 m)   Wt 222 lb (100.7 kg)   SpO2 99%   BMI 36.94 kg/m , BMI Body mass index is 36.94 kg/m.  Wt Readings from Last 3 Encounters:  06/02/21 222 lb (100.7 kg)  10/27/20 206 lb 12.8 oz (93.8 kg)  07/20/20 199 lb (90.3 kg)    General: Patient appears comfortable at rest. HEENT: Conjunctiva and lids normal, wearing a mask. Neck: Supple, no elevated JVP or carotid bruits, no thyromegaly. Lungs: Clear to auscultation, nonlabored breathing at rest. Cardiac: Regular rate and rhythm, no S3, 1/6 systolic murmur, no pericardial rub. Extremities: Mild ankle edema.  ECG:  An ECG dated 10/27/2020 was personally reviewed today and demonstrated:  Sinus rhythm with left atrial enlargement.  Recent Labwork:  April 2022: Cholesterol 120, HDL 49, LDL 53, creatinine 3.7, potassium 4.2  Other Studies Reviewed Today:  Echocardiogram 04/09/2020:  1. Left ventricular ejection fraction, by estimation, is approximately  50%. The left ventricle has low normal function. The left ventricle has no  regional wall motion abnormalities. Left ventricular diastolic parameters  are consistent with Grade II  diastolic dysfunction (pseudonormalization).   2. Right ventricular systolic function is normal. The right ventricular  size is normal. There is normal pulmonary artery systolic pressure. The  estimated right ventricular systolic pressure is 27.0 mmHg.   3. Left atrial size was mild to moderately dilated.   4. The mitral valve is grossly normal. Mild mitral valve regurgitation.   5. The aortic valve is  tricuspid. Aortic valve regurgitation is not  visualized.   6. The inferior vena cava is normal in size with greater than 50%  respiratory variability, suggesting right atrial pressure of 3 mmHg.   Assessment and Plan:  1.  HFrecEF, with history of suspected nonischemic cardiomyopathy and improvement of LVEF to 50% by echocardiogram in September of last year.  Medical therapy has been cut back in the setting of progressive renal insufficiency.  Now currently on Toprol-XL, Norvasc, and Lasix.  Plan to repeat echocardiogram and determine if any further medication adjustments are necessary at this time.  He is was clinically stable with NYHA class II dyspnea at current level of activity.  2.  CKD stage IV with progressive renal insufficiency.  Most recent creatinine 3.7.  He had been seeing Dr. Theador Hawthorne previously.  3.  Type 2 diabetes mellitus, currently on insulin.  He was taken off [place] and Glucotrol since last encounter.  Medication Adjustments/Labs and Tests Ordered: Current medicines are reviewed at length with the patient today.  Concerns regarding medicines are outlined above.   Tests Ordered: Orders Placed This Encounter  Procedures   ECHOCARDIOGRAM COMPLETE     Medication Changes: No orders of the defined types were placed in this encounter.   Disposition:  Follow up  test results and determine next step.  Signed, Satira Sark, MD, [facility] 06/02/2021 4:02 PM    [facility] at [place], [place], [place] [address] Phone: [phone]; Fax: [phone]

## 2021-06-02 NOTE — Patient Instructions (Addendum)

## 2021-06-10 DIAGNOSIS — I6900 Unspecified sequelae of nontraumatic subarachnoid hemorrhage: Secondary | ICD-10-CM | POA: Diagnosis not present

## 2021-06-28 DIAGNOSIS — H00011 Hordeolum externum right upper eyelid: Secondary | ICD-10-CM | POA: Diagnosis not present

## 2021-07-12 ENCOUNTER — Ambulatory Visit (HOSPITAL_COMMUNITY)
Admission: RE | Admit: 2021-07-12 | Discharge: 2021-07-12 | Disposition: A | Discharge status: Home / Self Care | Payer: Medicare HMO | Source: Ambulatory Visit | Attending: Cardiology | Admitting: Cardiology

## 2021-07-12 ENCOUNTER — Other Ambulatory Visit: Payer: Self-pay

## 2021-07-12 DIAGNOSIS — R69 Illness, unspecified: Secondary | ICD-10-CM | POA: Diagnosis not present

## 2021-07-12 DIAGNOSIS — J449 Chronic obstructive pulmonary disease, unspecified: Secondary | ICD-10-CM | POA: Insufficient documentation

## 2021-07-12 DIAGNOSIS — F17200 Nicotine dependence, unspecified, uncomplicated: Secondary | ICD-10-CM | POA: Insufficient documentation

## 2021-07-12 DIAGNOSIS — I509 Heart failure, unspecified: Secondary | ICD-10-CM | POA: Diagnosis not present

## 2021-07-12 DIAGNOSIS — I428 Other cardiomyopathies: Secondary | ICD-10-CM

## 2021-07-12 DIAGNOSIS — Z8673 Personal history of transient ischemic attack (TIA), and cerebral infarction without residual deficits: Secondary | ICD-10-CM | POA: Diagnosis not present

## 2021-07-12 DIAGNOSIS — I429 Cardiomyopathy, unspecified: Secondary | ICD-10-CM | POA: Insufficient documentation

## 2021-07-12 DIAGNOSIS — I110 Hypertensive heart disease with heart failure: Secondary | ICD-10-CM | POA: Diagnosis not present

## 2021-07-12 DIAGNOSIS — E785 Hyperlipidemia, unspecified: Secondary | ICD-10-CM | POA: Insufficient documentation

## 2021-07-12 DIAGNOSIS — E119 Type 2 diabetes mellitus without complications: Secondary | ICD-10-CM | POA: Diagnosis not present

## 2021-07-12 LAB — ECHOCARDIOGRAM COMPLETE
Area-P 1/2: 3.08 cm2
S' Lateral: 3.5 cm

## 2021-07-12 NOTE — Progress Notes (Signed)
*  PRELIMINARY RESULTS* Echocardiogram 2D Echocardiogram has been performed.  Luis Riley 07/12/2021, 1:53 PM

## 2021-07-14 ENCOUNTER — Telehealth: Payer: Self-pay | Admitting: *Deleted

## 2021-07-14 NOTE — Telephone Encounter (Signed)
Patient's wife informed. Copy sent to PCP 

## 2021-07-14 NOTE — Telephone Encounter (Signed)
-----   Message from Satira Sark, MD sent at 07/12/2021  5:05 PM EST ----- Results reviewed.  LVEF stable in low normal range at 50 to 55%.  Would continue with current medications and follow-up plan.

## 2021-08-10 DIAGNOSIS — E1142 Type 2 diabetes mellitus with diabetic polyneuropathy: Secondary | ICD-10-CM | POA: Diagnosis not present

## 2021-08-10 DIAGNOSIS — I10 Essential (primary) hypertension: Secondary | ICD-10-CM | POA: Diagnosis not present

## 2021-08-10 DIAGNOSIS — E7849 Other hyperlipidemia: Secondary | ICD-10-CM | POA: Diagnosis not present

## 2021-08-10 DIAGNOSIS — F01511 Vascular dementia, unspecified severity, with agitation: Secondary | ICD-10-CM | POA: Diagnosis not present

## 2021-08-10 DIAGNOSIS — I5042 Chronic combined systolic (congestive) and diastolic (congestive) heart failure: Secondary | ICD-10-CM | POA: Diagnosis not present

## 2021-08-27 ENCOUNTER — Telehealth: Payer: Self-pay | Admitting: Cardiology

## 2021-08-27 MED ORDER — FUROSEMIDE 20 MG PO TABS: 20.0000 mg | ORAL_TABLET | Freq: Every day | ORAL | 1 refills | Status: AC

## 2021-08-27 NOTE — Telephone Encounter (Signed)
° ° °*  STAT* If patient is at the pharmacy, call can be transferred to refill team.   1. Which medications need to be refilled? (please list name of each medication and dose if known) furosemide (LASIX) 20 MG tablet  2. Which pharmacy/location (including street and city if local pharmacy) is medication to be sent to? [facility], [place]  3. Do they need a 30 day or 90 day supply? 90 days

## 2021-09-01 DIAGNOSIS — J811 Chronic pulmonary edema: Secondary | ICD-10-CM | POA: Diagnosis not present

## 2021-09-01 DIAGNOSIS — J984 Other disorders of lung: Secondary | ICD-10-CM | POA: Diagnosis not present

## 2021-09-01 DIAGNOSIS — R93422 Abnormal radiologic findings on diagnostic imaging of left kidney: Secondary | ICD-10-CM | POA: Diagnosis not present

## 2021-09-01 DIAGNOSIS — D649 Anemia, unspecified: Secondary | ICD-10-CM | POA: Diagnosis not present

## 2021-09-01 DIAGNOSIS — R4020 Unspecified coma: Secondary | ICD-10-CM | POA: Diagnosis not present

## 2021-09-01 DIAGNOSIS — Z87891 Personal history of nicotine dependence: Secondary | ICD-10-CM | POA: Diagnosis not present

## 2021-09-01 DIAGNOSIS — J90 Pleural effusion, not elsewhere classified: Secondary | ICD-10-CM | POA: Diagnosis not present

## 2021-09-01 DIAGNOSIS — Z20822 Contact with and (suspected) exposure to covid-19: Secondary | ICD-10-CM | POA: Diagnosis not present

## 2021-09-01 DIAGNOSIS — Z743 Need for continuous supervision: Secondary | ICD-10-CM | POA: Diagnosis not present

## 2021-09-01 DIAGNOSIS — R410 Disorientation, unspecified: Secondary | ICD-10-CM | POA: Diagnosis not present

## 2021-09-01 DIAGNOSIS — G319 Degenerative disease of nervous system, unspecified: Secondary | ICD-10-CM | POA: Diagnosis not present

## 2021-09-01 DIAGNOSIS — I10 Essential (primary) hypertension: Secondary | ICD-10-CM | POA: Diagnosis not present

## 2021-09-01 DIAGNOSIS — I517 Cardiomegaly: Secondary | ICD-10-CM | POA: Diagnosis not present

## 2021-09-01 DIAGNOSIS — R9431 Abnormal electrocardiogram [ECG] [EKG]: Secondary | ICD-10-CM | POA: Diagnosis not present

## 2021-09-01 DIAGNOSIS — R918 Other nonspecific abnormal finding of lung field: Secondary | ICD-10-CM | POA: Diagnosis not present

## 2021-09-01 DIAGNOSIS — E119 Type 2 diabetes mellitus without complications: Secondary | ICD-10-CM | POA: Diagnosis not present

## 2021-09-01 DIAGNOSIS — Z9049 Acquired absence of other specified parts of digestive tract: Secondary | ICD-10-CM | POA: Diagnosis not present

## 2021-09-01 DIAGNOSIS — J449 Chronic obstructive pulmonary disease, unspecified: Secondary | ICD-10-CM | POA: Diagnosis not present

## 2021-09-01 DIAGNOSIS — A419 Sepsis, unspecified organism: Secondary | ICD-10-CM | POA: Diagnosis not present

## 2021-09-01 DIAGNOSIS — K922 Gastrointestinal hemorrhage, unspecified: Secondary | ICD-10-CM | POA: Diagnosis not present

## 2021-09-02 DIAGNOSIS — J984 Other disorders of lung: Secondary | ICD-10-CM | POA: Diagnosis not present

## 2021-09-02 DIAGNOSIS — F17210 Nicotine dependence, cigarettes, uncomplicated: Secondary | ICD-10-CM | POA: Diagnosis not present

## 2021-09-02 DIAGNOSIS — G319 Degenerative disease of nervous system, unspecified: Secondary | ICD-10-CM | POA: Diagnosis not present

## 2021-09-02 DIAGNOSIS — D509 Iron deficiency anemia, unspecified: Secondary | ICD-10-CM | POA: Diagnosis not present

## 2021-09-02 DIAGNOSIS — E119 Type 2 diabetes mellitus without complications: Secondary | ICD-10-CM | POA: Diagnosis not present

## 2021-09-02 DIAGNOSIS — Z8673 Personal history of transient ischemic attack (TIA), and cerebral infarction without residual deficits: Secondary | ICD-10-CM | POA: Diagnosis not present

## 2021-09-02 DIAGNOSIS — J449 Chronic obstructive pulmonary disease, unspecified: Secondary | ICD-10-CM | POA: Diagnosis not present

## 2021-09-02 DIAGNOSIS — I10 Essential (primary) hypertension: Secondary | ICD-10-CM | POA: Diagnosis not present

## 2021-09-02 DIAGNOSIS — I82613 Acute embolism and thrombosis of superficial veins of upper extremity, bilateral: Secondary | ICD-10-CM | POA: Diagnosis not present

## 2021-09-02 DIAGNOSIS — E785 Hyperlipidemia, unspecified: Secondary | ICD-10-CM | POA: Diagnosis not present

## 2021-09-02 DIAGNOSIS — E1122 Type 2 diabetes mellitus with diabetic chronic kidney disease: Secondary | ICD-10-CM | POA: Diagnosis not present

## 2021-09-02 DIAGNOSIS — J9601 Acute respiratory failure with hypoxia: Secondary | ICD-10-CM | POA: Diagnosis not present

## 2021-09-02 DIAGNOSIS — J690 Pneumonitis due to inhalation of food and vomit: Secondary | ICD-10-CM | POA: Diagnosis not present

## 2021-09-02 DIAGNOSIS — R918 Other nonspecific abnormal finding of lung field: Secondary | ICD-10-CM | POA: Diagnosis not present

## 2021-09-02 DIAGNOSIS — R9082 White matter disease, unspecified: Secondary | ICD-10-CM | POA: Diagnosis not present

## 2021-09-02 DIAGNOSIS — R93422 Abnormal radiologic findings on diagnostic imaging of left kidney: Secondary | ICD-10-CM | POA: Diagnosis not present

## 2021-09-02 DIAGNOSIS — E8770 Fluid overload, unspecified: Secondary | ICD-10-CM | POA: Diagnosis not present

## 2021-09-02 DIAGNOSIS — K922 Gastrointestinal hemorrhage, unspecified: Secondary | ICD-10-CM | POA: Diagnosis not present

## 2021-09-02 DIAGNOSIS — R451 Restlessness and agitation: Secondary | ICD-10-CM | POA: Diagnosis not present

## 2021-09-02 DIAGNOSIS — Z4682 Encounter for fitting and adjustment of non-vascular catheter: Secondary | ICD-10-CM | POA: Diagnosis not present

## 2021-09-02 DIAGNOSIS — I6782 Cerebral ischemia: Secondary | ICD-10-CM | POA: Diagnosis not present

## 2021-09-02 DIAGNOSIS — Z452 Encounter for adjustment and management of vascular access device: Secondary | ICD-10-CM | POA: Diagnosis not present

## 2021-09-02 DIAGNOSIS — R6521 Severe sepsis with septic shock: Secondary | ICD-10-CM | POA: Diagnosis not present

## 2021-09-02 DIAGNOSIS — I509 Heart failure, unspecified: Secondary | ICD-10-CM | POA: Diagnosis not present

## 2021-09-02 DIAGNOSIS — A419 Sepsis, unspecified organism: Secondary | ICD-10-CM | POA: Diagnosis not present

## 2021-09-02 DIAGNOSIS — Z9049 Acquired absence of other specified parts of digestive tract: Secondary | ICD-10-CM | POA: Diagnosis not present

## 2021-09-02 DIAGNOSIS — Z20822 Contact with and (suspected) exposure to covid-19: Secondary | ICD-10-CM | POA: Diagnosis not present

## 2021-09-02 DIAGNOSIS — Z66 Do not resuscitate: Secondary | ICD-10-CM | POA: Diagnosis not present

## 2021-09-02 DIAGNOSIS — Z7189 Other specified counseling: Secondary | ICD-10-CM | POA: Diagnosis not present

## 2021-09-02 DIAGNOSIS — R9431 Abnormal electrocardiogram [ECG] [EKG]: Secondary | ICD-10-CM | POA: Diagnosis not present

## 2021-09-02 DIAGNOSIS — E872 Acidosis, unspecified: Secondary | ICD-10-CM | POA: Diagnosis not present

## 2021-09-02 DIAGNOSIS — E875 Hyperkalemia: Secondary | ICD-10-CM | POA: Diagnosis not present

## 2021-09-02 DIAGNOSIS — R6520 Severe sepsis without septic shock: Secondary | ICD-10-CM | POA: Diagnosis not present

## 2021-09-02 DIAGNOSIS — J811 Chronic pulmonary edema: Secondary | ICD-10-CM | POA: Diagnosis not present

## 2021-09-02 DIAGNOSIS — R0603 Acute respiratory distress: Secondary | ICD-10-CM | POA: Diagnosis not present

## 2021-09-02 DIAGNOSIS — J90 Pleural effusion, not elsewhere classified: Secondary | ICD-10-CM | POA: Diagnosis not present

## 2021-09-02 DIAGNOSIS — I161 Hypertensive emergency: Secondary | ICD-10-CM | POA: Diagnosis not present

## 2021-09-02 DIAGNOSIS — N179 Acute kidney failure, unspecified: Secondary | ICD-10-CM | POA: Diagnosis not present

## 2021-09-02 DIAGNOSIS — Z515 Encounter for palliative care: Secondary | ICD-10-CM | POA: Diagnosis not present

## 2021-09-02 DIAGNOSIS — R0600 Dyspnea, unspecified: Secondary | ICD-10-CM | POA: Diagnosis not present

## 2021-09-02 DIAGNOSIS — J986 Disorders of diaphragm: Secondary | ICD-10-CM | POA: Diagnosis not present

## 2021-09-02 DIAGNOSIS — R0989 Other specified symptoms and signs involving the circulatory and respiratory systems: Secondary | ICD-10-CM | POA: Diagnosis not present

## 2021-09-02 DIAGNOSIS — N170 Acute kidney failure with tubular necrosis: Secondary | ICD-10-CM | POA: Diagnosis not present

## 2021-09-02 DIAGNOSIS — D500 Iron deficiency anemia secondary to blood loss (chronic): Secondary | ICD-10-CM | POA: Diagnosis not present

## 2021-09-02 DIAGNOSIS — G9341 Metabolic encephalopathy: Secondary | ICD-10-CM | POA: Diagnosis not present

## 2021-09-02 DIAGNOSIS — N184 Chronic kidney disease, stage 4 (severe): Secondary | ICD-10-CM | POA: Diagnosis not present

## 2021-09-02 DIAGNOSIS — J9690 Respiratory failure, unspecified, unspecified whether with hypoxia or hypercapnia: Secondary | ICD-10-CM | POA: Diagnosis not present

## 2021-09-02 DIAGNOSIS — R569 Unspecified convulsions: Secondary | ICD-10-CM | POA: Diagnosis not present

## 2021-09-02 DIAGNOSIS — D62 Acute posthemorrhagic anemia: Secondary | ICD-10-CM | POA: Diagnosis not present

## 2021-09-02 DIAGNOSIS — G9340 Encephalopathy, unspecified: Secondary | ICD-10-CM | POA: Diagnosis not present

## 2021-09-02 DIAGNOSIS — Z9911 Dependence on respirator [ventilator] status: Secondary | ICD-10-CM | POA: Diagnosis not present

## 2021-09-02 DIAGNOSIS — Z789 Other specified health status: Secondary | ICD-10-CM | POA: Diagnosis not present

## 2021-09-02 DIAGNOSIS — N19 Unspecified kidney failure: Secondary | ICD-10-CM | POA: Diagnosis not present

## 2021-09-02 DIAGNOSIS — R7989 Other specified abnormal findings of blood chemistry: Secondary | ICD-10-CM | POA: Diagnosis not present

## 2021-09-02 DIAGNOSIS — I517 Cardiomegaly: Secondary | ICD-10-CM | POA: Diagnosis not present

## 2021-09-02 DIAGNOSIS — I5020 Unspecified systolic (congestive) heart failure: Secondary | ICD-10-CM | POA: Diagnosis not present

## 2021-09-02 DIAGNOSIS — G928 Other toxic encephalopathy: Secondary | ICD-10-CM | POA: Diagnosis not present

## 2021-09-02 DIAGNOSIS — R0602 Shortness of breath: Secondary | ICD-10-CM | POA: Diagnosis not present

## 2021-09-02 DIAGNOSIS — J9811 Atelectasis: Secondary | ICD-10-CM | POA: Diagnosis not present

## 2021-09-02 DIAGNOSIS — I130 Hypertensive heart and chronic kidney disease with heart failure and stage 1 through stage 4 chronic kidney disease, or unspecified chronic kidney disease: Secondary | ICD-10-CM | POA: Diagnosis not present

## 2021-09-03 DIAGNOSIS — R6520 Severe sepsis without septic shock: Secondary | ICD-10-CM | POA: Diagnosis not present

## 2021-09-03 DIAGNOSIS — D500 Iron deficiency anemia secondary to blood loss (chronic): Secondary | ICD-10-CM | POA: Diagnosis not present

## 2021-09-03 DIAGNOSIS — J9601 Acute respiratory failure with hypoxia: Secondary | ICD-10-CM | POA: Diagnosis not present

## 2021-09-03 DIAGNOSIS — K922 Gastrointestinal hemorrhage, unspecified: Secondary | ICD-10-CM | POA: Diagnosis not present

## 2021-09-03 DIAGNOSIS — G9341 Metabolic encephalopathy: Secondary | ICD-10-CM | POA: Diagnosis not present

## 2021-09-03 DIAGNOSIS — J9811 Atelectasis: Secondary | ICD-10-CM | POA: Diagnosis not present

## 2021-09-03 DIAGNOSIS — J90 Pleural effusion, not elsewhere classified: Secondary | ICD-10-CM | POA: Diagnosis not present

## 2021-09-03 DIAGNOSIS — R0600 Dyspnea, unspecified: Secondary | ICD-10-CM | POA: Diagnosis not present

## 2021-09-03 DIAGNOSIS — D62 Acute posthemorrhagic anemia: Secondary | ICD-10-CM | POA: Diagnosis not present

## 2021-09-03 DIAGNOSIS — E872 Acidosis, unspecified: Secondary | ICD-10-CM | POA: Diagnosis not present

## 2021-09-03 DIAGNOSIS — A419 Sepsis, unspecified organism: Secondary | ICD-10-CM | POA: Diagnosis not present

## 2021-09-03 DIAGNOSIS — N184 Chronic kidney disease, stage 4 (severe): Secondary | ICD-10-CM | POA: Diagnosis not present

## 2021-09-03 DIAGNOSIS — G319 Degenerative disease of nervous system, unspecified: Secondary | ICD-10-CM | POA: Diagnosis not present

## 2021-09-03 DIAGNOSIS — I10 Essential (primary) hypertension: Secondary | ICD-10-CM | POA: Diagnosis not present

## 2021-09-03 DIAGNOSIS — G9340 Encephalopathy, unspecified: Secondary | ICD-10-CM | POA: Diagnosis not present

## 2021-09-03 DIAGNOSIS — I6782 Cerebral ischemia: Secondary | ICD-10-CM | POA: Diagnosis not present

## 2021-09-03 DIAGNOSIS — R569 Unspecified convulsions: Secondary | ICD-10-CM | POA: Diagnosis not present

## 2021-09-03 DIAGNOSIS — D509 Iron deficiency anemia, unspecified: Secondary | ICD-10-CM | POA: Diagnosis not present

## 2021-09-03 DIAGNOSIS — I509 Heart failure, unspecified: Secondary | ICD-10-CM | POA: Diagnosis not present

## 2021-09-03 DIAGNOSIS — N179 Acute kidney failure, unspecified: Secondary | ICD-10-CM | POA: Diagnosis not present

## 2021-09-03 DIAGNOSIS — J690 Pneumonitis due to inhalation of food and vomit: Secondary | ICD-10-CM | POA: Diagnosis not present

## 2021-09-03 DIAGNOSIS — Z8673 Personal history of transient ischemic attack (TIA), and cerebral infarction without residual deficits: Secondary | ICD-10-CM | POA: Diagnosis not present

## 2021-09-04 DIAGNOSIS — A419 Sepsis, unspecified organism: Secondary | ICD-10-CM | POA: Diagnosis not present

## 2021-09-04 DIAGNOSIS — E872 Acidosis, unspecified: Secondary | ICD-10-CM | POA: Diagnosis not present

## 2021-09-04 DIAGNOSIS — E8770 Fluid overload, unspecified: Secondary | ICD-10-CM | POA: Diagnosis not present

## 2021-09-04 DIAGNOSIS — D509 Iron deficiency anemia, unspecified: Secondary | ICD-10-CM | POA: Diagnosis not present

## 2021-09-04 DIAGNOSIS — J984 Other disorders of lung: Secondary | ICD-10-CM | POA: Diagnosis not present

## 2021-09-04 DIAGNOSIS — E875 Hyperkalemia: Secondary | ICD-10-CM | POA: Diagnosis not present

## 2021-09-04 DIAGNOSIS — Z8673 Personal history of transient ischemic attack (TIA), and cerebral infarction without residual deficits: Secondary | ICD-10-CM | POA: Diagnosis not present

## 2021-09-04 DIAGNOSIS — J9601 Acute respiratory failure with hypoxia: Secondary | ICD-10-CM | POA: Diagnosis not present

## 2021-09-04 DIAGNOSIS — G9341 Metabolic encephalopathy: Secondary | ICD-10-CM | POA: Diagnosis not present

## 2021-09-04 DIAGNOSIS — R0602 Shortness of breath: Secondary | ICD-10-CM | POA: Diagnosis not present

## 2021-09-04 DIAGNOSIS — N184 Chronic kidney disease, stage 4 (severe): Secondary | ICD-10-CM | POA: Diagnosis not present

## 2021-09-04 DIAGNOSIS — I509 Heart failure, unspecified: Secondary | ICD-10-CM | POA: Diagnosis not present

## 2021-09-04 DIAGNOSIS — J9690 Respiratory failure, unspecified, unspecified whether with hypoxia or hypercapnia: Secondary | ICD-10-CM | POA: Diagnosis not present

## 2021-09-04 DIAGNOSIS — I10 Essential (primary) hypertension: Secondary | ICD-10-CM | POA: Diagnosis not present

## 2021-09-04 DIAGNOSIS — N19 Unspecified kidney failure: Secondary | ICD-10-CM | POA: Diagnosis not present

## 2021-09-04 DIAGNOSIS — N179 Acute kidney failure, unspecified: Secondary | ICD-10-CM | POA: Diagnosis not present

## 2021-09-04 DIAGNOSIS — G9340 Encephalopathy, unspecified: Secondary | ICD-10-CM | POA: Diagnosis not present

## 2021-09-05 DIAGNOSIS — G9340 Encephalopathy, unspecified: Secondary | ICD-10-CM | POA: Diagnosis not present

## 2021-09-05 DIAGNOSIS — E872 Acidosis, unspecified: Secondary | ICD-10-CM | POA: Diagnosis not present

## 2021-09-05 DIAGNOSIS — I509 Heart failure, unspecified: Secondary | ICD-10-CM | POA: Diagnosis not present

## 2021-09-05 DIAGNOSIS — I10 Essential (primary) hypertension: Secondary | ICD-10-CM | POA: Diagnosis not present

## 2021-09-05 DIAGNOSIS — E875 Hyperkalemia: Secondary | ICD-10-CM | POA: Diagnosis not present

## 2021-09-05 DIAGNOSIS — N184 Chronic kidney disease, stage 4 (severe): Secondary | ICD-10-CM | POA: Diagnosis not present

## 2021-09-05 DIAGNOSIS — J9601 Acute respiratory failure with hypoxia: Secondary | ICD-10-CM | POA: Diagnosis not present

## 2021-09-05 DIAGNOSIS — N179 Acute kidney failure, unspecified: Secondary | ICD-10-CM | POA: Diagnosis not present

## 2021-09-05 DIAGNOSIS — J9690 Respiratory failure, unspecified, unspecified whether with hypoxia or hypercapnia: Secondary | ICD-10-CM | POA: Diagnosis not present

## 2021-09-05 DIAGNOSIS — A419 Sepsis, unspecified organism: Secondary | ICD-10-CM | POA: Diagnosis not present

## 2021-09-05 DIAGNOSIS — R0989 Other specified symptoms and signs involving the circulatory and respiratory systems: Secondary | ICD-10-CM | POA: Diagnosis not present

## 2021-09-05 DIAGNOSIS — R6521 Severe sepsis with septic shock: Secondary | ICD-10-CM | POA: Diagnosis not present

## 2021-09-05 DIAGNOSIS — R918 Other nonspecific abnormal finding of lung field: Secondary | ICD-10-CM | POA: Diagnosis not present

## 2021-09-05 DIAGNOSIS — Z452 Encounter for adjustment and management of vascular access device: Secondary | ICD-10-CM | POA: Diagnosis not present

## 2021-09-05 DIAGNOSIS — E8770 Fluid overload, unspecified: Secondary | ICD-10-CM | POA: Diagnosis not present

## 2021-09-05 DIAGNOSIS — G9341 Metabolic encephalopathy: Secondary | ICD-10-CM | POA: Diagnosis not present

## 2021-09-05 DIAGNOSIS — N19 Unspecified kidney failure: Secondary | ICD-10-CM | POA: Diagnosis not present

## 2021-09-05 DIAGNOSIS — Z4682 Encounter for fitting and adjustment of non-vascular catheter: Secondary | ICD-10-CM | POA: Diagnosis not present

## 2021-09-05 DIAGNOSIS — Z8673 Personal history of transient ischemic attack (TIA), and cerebral infarction without residual deficits: Secondary | ICD-10-CM | POA: Diagnosis not present

## 2021-09-06 DIAGNOSIS — G9341 Metabolic encephalopathy: Secondary | ICD-10-CM | POA: Diagnosis not present

## 2021-09-06 DIAGNOSIS — I10 Essential (primary) hypertension: Secondary | ICD-10-CM | POA: Diagnosis not present

## 2021-09-06 DIAGNOSIS — A419 Sepsis, unspecified organism: Secondary | ICD-10-CM | POA: Diagnosis not present

## 2021-09-06 DIAGNOSIS — J986 Disorders of diaphragm: Secondary | ICD-10-CM | POA: Diagnosis not present

## 2021-09-06 DIAGNOSIS — R0989 Other specified symptoms and signs involving the circulatory and respiratory systems: Secondary | ICD-10-CM | POA: Diagnosis not present

## 2021-09-06 DIAGNOSIS — R918 Other nonspecific abnormal finding of lung field: Secondary | ICD-10-CM | POA: Diagnosis not present

## 2021-09-06 DIAGNOSIS — N179 Acute kidney failure, unspecified: Secondary | ICD-10-CM | POA: Diagnosis not present

## 2021-09-06 DIAGNOSIS — I509 Heart failure, unspecified: Secondary | ICD-10-CM | POA: Diagnosis not present

## 2021-09-06 DIAGNOSIS — J9601 Acute respiratory failure with hypoxia: Secondary | ICD-10-CM | POA: Diagnosis not present

## 2021-09-06 DIAGNOSIS — R6521 Severe sepsis with septic shock: Secondary | ICD-10-CM | POA: Diagnosis not present

## 2021-09-06 DIAGNOSIS — Z8673 Personal history of transient ischemic attack (TIA), and cerebral infarction without residual deficits: Secondary | ICD-10-CM | POA: Diagnosis not present

## 2021-09-06 DIAGNOSIS — I517 Cardiomegaly: Secondary | ICD-10-CM | POA: Diagnosis not present

## 2021-09-06 DIAGNOSIS — J9690 Respiratory failure, unspecified, unspecified whether with hypoxia or hypercapnia: Secondary | ICD-10-CM | POA: Diagnosis not present

## 2021-09-06 DIAGNOSIS — G9340 Encephalopathy, unspecified: Secondary | ICD-10-CM | POA: Diagnosis not present

## 2021-09-06 DIAGNOSIS — N184 Chronic kidney disease, stage 4 (severe): Secondary | ICD-10-CM | POA: Diagnosis not present

## 2021-09-07 DIAGNOSIS — Z9049 Acquired absence of other specified parts of digestive tract: Secondary | ICD-10-CM | POA: Diagnosis not present

## 2021-09-07 DIAGNOSIS — J9690 Respiratory failure, unspecified, unspecified whether with hypoxia or hypercapnia: Secondary | ICD-10-CM | POA: Diagnosis not present

## 2021-09-07 DIAGNOSIS — R6521 Severe sepsis with septic shock: Secondary | ICD-10-CM | POA: Diagnosis not present

## 2021-09-07 DIAGNOSIS — J984 Other disorders of lung: Secondary | ICD-10-CM | POA: Diagnosis not present

## 2021-09-07 DIAGNOSIS — Z452 Encounter for adjustment and management of vascular access device: Secondary | ICD-10-CM | POA: Diagnosis not present

## 2021-09-07 DIAGNOSIS — R0602 Shortness of breath: Secondary | ICD-10-CM | POA: Diagnosis not present

## 2021-09-07 DIAGNOSIS — R7989 Other specified abnormal findings of blood chemistry: Secondary | ICD-10-CM | POA: Diagnosis not present

## 2021-09-07 DIAGNOSIS — A419 Sepsis, unspecified organism: Secondary | ICD-10-CM | POA: Diagnosis not present

## 2021-09-07 DIAGNOSIS — N179 Acute kidney failure, unspecified: Secondary | ICD-10-CM | POA: Diagnosis not present

## 2021-09-07 DIAGNOSIS — N184 Chronic kidney disease, stage 4 (severe): Secondary | ICD-10-CM | POA: Diagnosis not present

## 2021-09-07 DIAGNOSIS — G9341 Metabolic encephalopathy: Secondary | ICD-10-CM | POA: Diagnosis not present

## 2021-09-07 DIAGNOSIS — G9340 Encephalopathy, unspecified: Secondary | ICD-10-CM | POA: Diagnosis not present

## 2021-09-07 DIAGNOSIS — J811 Chronic pulmonary edema: Secondary | ICD-10-CM | POA: Diagnosis not present

## 2021-09-07 DIAGNOSIS — J9601 Acute respiratory failure with hypoxia: Secondary | ICD-10-CM | POA: Diagnosis not present

## 2021-09-08 DIAGNOSIS — J90 Pleural effusion, not elsewhere classified: Secondary | ICD-10-CM | POA: Diagnosis not present

## 2021-09-08 DIAGNOSIS — J9601 Acute respiratory failure with hypoxia: Secondary | ICD-10-CM | POA: Diagnosis not present

## 2021-09-08 DIAGNOSIS — R918 Other nonspecific abnormal finding of lung field: Secondary | ICD-10-CM | POA: Diagnosis not present

## 2021-09-08 DIAGNOSIS — J984 Other disorders of lung: Secondary | ICD-10-CM | POA: Diagnosis not present

## 2021-09-08 DIAGNOSIS — N179 Acute kidney failure, unspecified: Secondary | ICD-10-CM | POA: Diagnosis not present

## 2021-09-08 DIAGNOSIS — A419 Sepsis, unspecified organism: Secondary | ICD-10-CM | POA: Diagnosis not present

## 2021-09-08 DIAGNOSIS — G9341 Metabolic encephalopathy: Secondary | ICD-10-CM | POA: Diagnosis not present

## 2021-09-08 DIAGNOSIS — J9690 Respiratory failure, unspecified, unspecified whether with hypoxia or hypercapnia: Secondary | ICD-10-CM | POA: Diagnosis not present

## 2021-09-08 DIAGNOSIS — I517 Cardiomegaly: Secondary | ICD-10-CM | POA: Diagnosis not present

## 2021-09-08 DIAGNOSIS — G9340 Encephalopathy, unspecified: Secondary | ICD-10-CM | POA: Diagnosis not present

## 2021-09-08 DIAGNOSIS — R6521 Severe sepsis with septic shock: Secondary | ICD-10-CM | POA: Diagnosis not present

## 2021-09-09 DIAGNOSIS — R0602 Shortness of breath: Secondary | ICD-10-CM | POA: Diagnosis not present

## 2021-09-09 DIAGNOSIS — A419 Sepsis, unspecified organism: Secondary | ICD-10-CM | POA: Diagnosis not present

## 2021-09-09 DIAGNOSIS — J9690 Respiratory failure, unspecified, unspecified whether with hypoxia or hypercapnia: Secondary | ICD-10-CM | POA: Diagnosis not present

## 2021-09-09 DIAGNOSIS — J9601 Acute respiratory failure with hypoxia: Secondary | ICD-10-CM | POA: Diagnosis not present

## 2021-09-09 DIAGNOSIS — G9341 Metabolic encephalopathy: Secondary | ICD-10-CM | POA: Diagnosis not present

## 2021-09-09 DIAGNOSIS — J984 Other disorders of lung: Secondary | ICD-10-CM | POA: Diagnosis not present

## 2021-09-09 DIAGNOSIS — R6521 Severe sepsis with septic shock: Secondary | ICD-10-CM | POA: Diagnosis not present

## 2021-09-09 DIAGNOSIS — R918 Other nonspecific abnormal finding of lung field: Secondary | ICD-10-CM | POA: Diagnosis not present

## 2021-09-09 DIAGNOSIS — N179 Acute kidney failure, unspecified: Secondary | ICD-10-CM | POA: Diagnosis not present

## 2021-09-09 DIAGNOSIS — I82613 Acute embolism and thrombosis of superficial veins of upper extremity, bilateral: Secondary | ICD-10-CM | POA: Diagnosis not present

## 2021-09-09 DIAGNOSIS — G9340 Encephalopathy, unspecified: Secondary | ICD-10-CM | POA: Diagnosis not present

## 2021-09-10 DIAGNOSIS — G9341 Metabolic encephalopathy: Secondary | ICD-10-CM | POA: Diagnosis not present

## 2021-09-10 DIAGNOSIS — R6520 Severe sepsis without septic shock: Secondary | ICD-10-CM | POA: Diagnosis not present

## 2021-09-10 DIAGNOSIS — N179 Acute kidney failure, unspecified: Secondary | ICD-10-CM | POA: Diagnosis not present

## 2021-09-10 DIAGNOSIS — D62 Acute posthemorrhagic anemia: Secondary | ICD-10-CM | POA: Diagnosis not present

## 2021-09-10 DIAGNOSIS — R0603 Acute respiratory distress: Secondary | ICD-10-CM | POA: Diagnosis not present

## 2021-09-10 DIAGNOSIS — J9601 Acute respiratory failure with hypoxia: Secondary | ICD-10-CM | POA: Diagnosis not present

## 2021-09-10 DIAGNOSIS — G9340 Encephalopathy, unspecified: Secondary | ICD-10-CM | POA: Diagnosis not present

## 2021-09-10 DIAGNOSIS — A419 Sepsis, unspecified organism: Secondary | ICD-10-CM | POA: Diagnosis not present

## 2021-09-11 DIAGNOSIS — A419 Sepsis, unspecified organism: Secondary | ICD-10-CM | POA: Diagnosis not present

## 2021-09-11 DIAGNOSIS — J9601 Acute respiratory failure with hypoxia: Secondary | ICD-10-CM | POA: Diagnosis not present

## 2021-09-11 DIAGNOSIS — G9340 Encephalopathy, unspecified: Secondary | ICD-10-CM | POA: Diagnosis not present

## 2021-09-11 DIAGNOSIS — N179 Acute kidney failure, unspecified: Secondary | ICD-10-CM | POA: Diagnosis not present

## 2021-09-11 DIAGNOSIS — R6520 Severe sepsis without septic shock: Secondary | ICD-10-CM | POA: Diagnosis not present

## 2021-09-11 DIAGNOSIS — G9341 Metabolic encephalopathy: Secondary | ICD-10-CM | POA: Diagnosis not present

## 2021-09-11 DIAGNOSIS — D62 Acute posthemorrhagic anemia: Secondary | ICD-10-CM | POA: Diagnosis not present

## 2021-09-12 DIAGNOSIS — J9690 Respiratory failure, unspecified, unspecified whether with hypoxia or hypercapnia: Secondary | ICD-10-CM | POA: Diagnosis not present

## 2021-09-12 DIAGNOSIS — R6520 Severe sepsis without septic shock: Secondary | ICD-10-CM | POA: Diagnosis not present

## 2021-09-12 DIAGNOSIS — D62 Acute posthemorrhagic anemia: Secondary | ICD-10-CM | POA: Diagnosis not present

## 2021-09-12 DIAGNOSIS — A419 Sepsis, unspecified organism: Secondary | ICD-10-CM | POA: Diagnosis not present

## 2021-09-12 DIAGNOSIS — J9601 Acute respiratory failure with hypoxia: Secondary | ICD-10-CM | POA: Diagnosis not present

## 2021-09-12 DIAGNOSIS — R6521 Severe sepsis with septic shock: Secondary | ICD-10-CM | POA: Diagnosis not present

## 2021-09-12 DIAGNOSIS — G9340 Encephalopathy, unspecified: Secondary | ICD-10-CM | POA: Diagnosis not present

## 2021-09-12 DIAGNOSIS — N179 Acute kidney failure, unspecified: Secondary | ICD-10-CM | POA: Diagnosis not present

## 2021-09-12 DIAGNOSIS — G9341 Metabolic encephalopathy: Secondary | ICD-10-CM | POA: Diagnosis not present

## 2021-09-13 DIAGNOSIS — J9601 Acute respiratory failure with hypoxia: Secondary | ICD-10-CM | POA: Diagnosis not present

## 2021-09-13 DIAGNOSIS — N179 Acute kidney failure, unspecified: Secondary | ICD-10-CM | POA: Diagnosis not present

## 2021-09-13 DIAGNOSIS — Z8673 Personal history of transient ischemic attack (TIA), and cerebral infarction without residual deficits: Secondary | ICD-10-CM | POA: Diagnosis not present

## 2021-09-13 DIAGNOSIS — R6521 Severe sepsis with septic shock: Secondary | ICD-10-CM | POA: Diagnosis not present

## 2021-09-13 DIAGNOSIS — Z9911 Dependence on respirator [ventilator] status: Secondary | ICD-10-CM | POA: Diagnosis not present

## 2021-09-13 DIAGNOSIS — A419 Sepsis, unspecified organism: Secondary | ICD-10-CM | POA: Diagnosis not present

## 2021-09-13 DIAGNOSIS — G9340 Encephalopathy, unspecified: Secondary | ICD-10-CM | POA: Diagnosis not present

## 2021-09-13 DIAGNOSIS — N184 Chronic kidney disease, stage 4 (severe): Secondary | ICD-10-CM | POA: Diagnosis not present

## 2021-09-14 DIAGNOSIS — J9601 Acute respiratory failure with hypoxia: Secondary | ICD-10-CM | POA: Diagnosis not present

## 2021-09-14 DIAGNOSIS — Z8673 Personal history of transient ischemic attack (TIA), and cerebral infarction without residual deficits: Secondary | ICD-10-CM | POA: Diagnosis not present

## 2021-09-14 DIAGNOSIS — A419 Sepsis, unspecified organism: Secondary | ICD-10-CM | POA: Diagnosis not present

## 2021-09-14 DIAGNOSIS — Z9911 Dependence on respirator [ventilator] status: Secondary | ICD-10-CM | POA: Diagnosis not present

## 2021-09-14 DIAGNOSIS — N184 Chronic kidney disease, stage 4 (severe): Secondary | ICD-10-CM | POA: Diagnosis not present

## 2021-09-14 DIAGNOSIS — N179 Acute kidney failure, unspecified: Secondary | ICD-10-CM | POA: Diagnosis not present

## 2021-09-14 DIAGNOSIS — G9340 Encephalopathy, unspecified: Secondary | ICD-10-CM | POA: Diagnosis not present

## 2021-09-15 DIAGNOSIS — Z9911 Dependence on respirator [ventilator] status: Secondary | ICD-10-CM | POA: Diagnosis not present

## 2021-09-15 DIAGNOSIS — A419 Sepsis, unspecified organism: Secondary | ICD-10-CM | POA: Diagnosis not present

## 2021-09-15 DIAGNOSIS — G9340 Encephalopathy, unspecified: Secondary | ICD-10-CM | POA: Diagnosis not present

## 2021-09-15 DIAGNOSIS — R6521 Severe sepsis with septic shock: Secondary | ICD-10-CM | POA: Diagnosis not present

## 2021-09-15 DIAGNOSIS — N179 Acute kidney failure, unspecified: Secondary | ICD-10-CM | POA: Diagnosis not present

## 2021-09-15 DIAGNOSIS — N184 Chronic kidney disease, stage 4 (severe): Secondary | ICD-10-CM | POA: Diagnosis not present

## 2021-09-15 DIAGNOSIS — J9601 Acute respiratory failure with hypoxia: Secondary | ICD-10-CM | POA: Diagnosis not present

## 2021-09-15 DIAGNOSIS — Z8673 Personal history of transient ischemic attack (TIA), and cerebral infarction without residual deficits: Secondary | ICD-10-CM | POA: Diagnosis not present

## 2021-09-16 DIAGNOSIS — I517 Cardiomegaly: Secondary | ICD-10-CM | POA: Diagnosis not present

## 2021-09-16 DIAGNOSIS — J9601 Acute respiratory failure with hypoxia: Secondary | ICD-10-CM | POA: Diagnosis not present

## 2021-09-16 DIAGNOSIS — Z515 Encounter for palliative care: Secondary | ICD-10-CM | POA: Diagnosis not present

## 2021-09-16 DIAGNOSIS — G9340 Encephalopathy, unspecified: Secondary | ICD-10-CM | POA: Diagnosis not present

## 2021-09-16 DIAGNOSIS — N184 Chronic kidney disease, stage 4 (severe): Secondary | ICD-10-CM | POA: Diagnosis not present

## 2021-09-16 DIAGNOSIS — G9341 Metabolic encephalopathy: Secondary | ICD-10-CM | POA: Diagnosis not present

## 2021-09-16 DIAGNOSIS — R0602 Shortness of breath: Secondary | ICD-10-CM | POA: Diagnosis not present

## 2021-09-16 DIAGNOSIS — Z789 Other specified health status: Secondary | ICD-10-CM | POA: Diagnosis not present

## 2021-09-16 DIAGNOSIS — Z7189 Other specified counseling: Secondary | ICD-10-CM | POA: Diagnosis not present

## 2021-09-16 DIAGNOSIS — Z8673 Personal history of transient ischemic attack (TIA), and cerebral infarction without residual deficits: Secondary | ICD-10-CM | POA: Diagnosis not present

## 2021-09-16 DIAGNOSIS — N179 Acute kidney failure, unspecified: Secondary | ICD-10-CM | POA: Diagnosis not present

## 2021-09-16 DIAGNOSIS — Z9911 Dependence on respirator [ventilator] status: Secondary | ICD-10-CM | POA: Diagnosis not present

## 2021-09-16 DIAGNOSIS — Z4682 Encounter for fitting and adjustment of non-vascular catheter: Secondary | ICD-10-CM | POA: Diagnosis not present

## 2021-09-16 DIAGNOSIS — R6521 Severe sepsis with septic shock: Secondary | ICD-10-CM | POA: Diagnosis not present

## 2021-09-16 DIAGNOSIS — J811 Chronic pulmonary edema: Secondary | ICD-10-CM | POA: Diagnosis not present

## 2021-09-16 DIAGNOSIS — A419 Sepsis, unspecified organism: Secondary | ICD-10-CM | POA: Diagnosis not present

## 2021-09-17 DIAGNOSIS — Z9911 Dependence on respirator [ventilator] status: Secondary | ICD-10-CM | POA: Diagnosis not present

## 2021-09-17 DIAGNOSIS — G9341 Metabolic encephalopathy: Secondary | ICD-10-CM | POA: Diagnosis not present

## 2021-09-17 DIAGNOSIS — Z789 Other specified health status: Secondary | ICD-10-CM | POA: Diagnosis not present

## 2021-09-17 DIAGNOSIS — G9340 Encephalopathy, unspecified: Secondary | ICD-10-CM | POA: Diagnosis not present

## 2021-09-17 DIAGNOSIS — J9601 Acute respiratory failure with hypoxia: Secondary | ICD-10-CM | POA: Diagnosis not present

## 2021-09-17 DIAGNOSIS — N179 Acute kidney failure, unspecified: Secondary | ICD-10-CM | POA: Diagnosis not present

## 2021-09-17 DIAGNOSIS — Z515 Encounter for palliative care: Secondary | ICD-10-CM | POA: Diagnosis not present

## 2021-09-17 DIAGNOSIS — Z7189 Other specified counseling: Secondary | ICD-10-CM | POA: Diagnosis not present

## 2021-09-17 DIAGNOSIS — N184 Chronic kidney disease, stage 4 (severe): Secondary | ICD-10-CM | POA: Diagnosis not present

## 2021-09-17 DIAGNOSIS — Z8673 Personal history of transient ischemic attack (TIA), and cerebral infarction without residual deficits: Secondary | ICD-10-CM | POA: Diagnosis not present

## 2021-09-17 DIAGNOSIS — R6521 Severe sepsis with septic shock: Secondary | ICD-10-CM | POA: Diagnosis not present

## 2021-09-17 DIAGNOSIS — Z4682 Encounter for fitting and adjustment of non-vascular catheter: Secondary | ICD-10-CM | POA: Diagnosis not present

## 2021-09-17 DIAGNOSIS — A419 Sepsis, unspecified organism: Secondary | ICD-10-CM | POA: Diagnosis not present

## 2021-09-18 DIAGNOSIS — G9341 Metabolic encephalopathy: Secondary | ICD-10-CM | POA: Diagnosis not present

## 2021-09-18 DIAGNOSIS — N184 Chronic kidney disease, stage 4 (severe): Secondary | ICD-10-CM | POA: Diagnosis not present

## 2021-09-18 DIAGNOSIS — Z7189 Other specified counseling: Secondary | ICD-10-CM | POA: Diagnosis not present

## 2021-09-18 DIAGNOSIS — J9601 Acute respiratory failure with hypoxia: Secondary | ICD-10-CM | POA: Diagnosis not present

## 2021-09-18 DIAGNOSIS — Z789 Other specified health status: Secondary | ICD-10-CM | POA: Diagnosis not present

## 2021-09-18 DIAGNOSIS — Z515 Encounter for palliative care: Secondary | ICD-10-CM | POA: Diagnosis not present

## 2021-09-18 DIAGNOSIS — Z9911 Dependence on respirator [ventilator] status: Secondary | ICD-10-CM | POA: Diagnosis not present

## 2021-09-18 DIAGNOSIS — Z8673 Personal history of transient ischemic attack (TIA), and cerebral infarction without residual deficits: Secondary | ICD-10-CM | POA: Diagnosis not present

## 2021-09-18 DIAGNOSIS — G9340 Encephalopathy, unspecified: Secondary | ICD-10-CM | POA: Diagnosis not present

## 2021-09-18 DIAGNOSIS — N179 Acute kidney failure, unspecified: Secondary | ICD-10-CM | POA: Diagnosis not present

## 2021-09-19 DIAGNOSIS — J9601 Acute respiratory failure with hypoxia: Secondary | ICD-10-CM | POA: Diagnosis not present

## 2021-09-19 DIAGNOSIS — G9340 Encephalopathy, unspecified: Secondary | ICD-10-CM | POA: Diagnosis not present

## 2021-09-19 DIAGNOSIS — R6521 Severe sepsis with septic shock: Secondary | ICD-10-CM | POA: Diagnosis not present

## 2021-09-19 DIAGNOSIS — N184 Chronic kidney disease, stage 4 (severe): Secondary | ICD-10-CM | POA: Diagnosis not present

## 2021-09-19 DIAGNOSIS — N179 Acute kidney failure, unspecified: Secondary | ICD-10-CM | POA: Diagnosis not present

## 2021-09-19 DIAGNOSIS — R6520 Severe sepsis without septic shock: Secondary | ICD-10-CM | POA: Diagnosis not present

## 2021-09-19 DIAGNOSIS — D62 Acute posthemorrhagic anemia: Secondary | ICD-10-CM | POA: Diagnosis not present

## 2021-09-19 DIAGNOSIS — A419 Sepsis, unspecified organism: Secondary | ICD-10-CM | POA: Diagnosis not present

## 2021-10-06 DEATH — deceased

## 2021-11-26 ENCOUNTER — Ambulatory Visit: Payer: Medicare HMO | Admitting: Cardiology

## 2022-04-14 IMAGING — US US RENAL
1 series · 14 of 25 positions shown · non-contrast
Comparison: CT 07/25/2019.

CLINICAL DATA: Acute renal failure.

EXAM:
RENAL / URINARY TRACT ULTRASOUND COMPLETE

[Series 1: us renal · 14 of 42 slices shown]
[im 1/42]
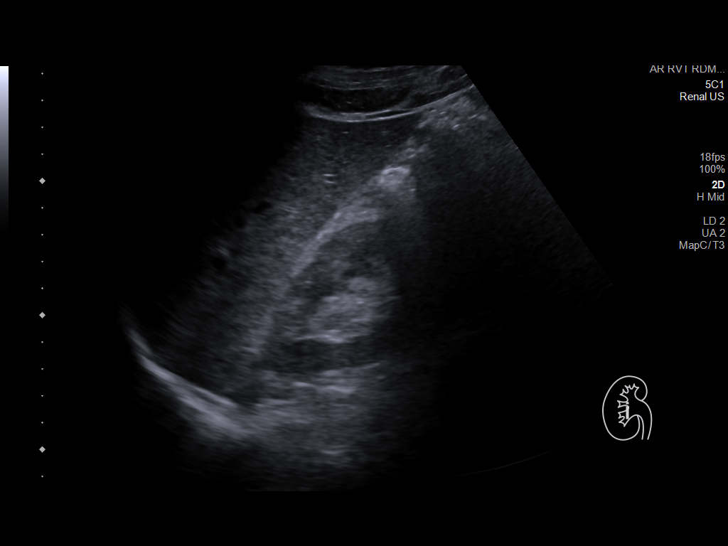
[im 4/42]
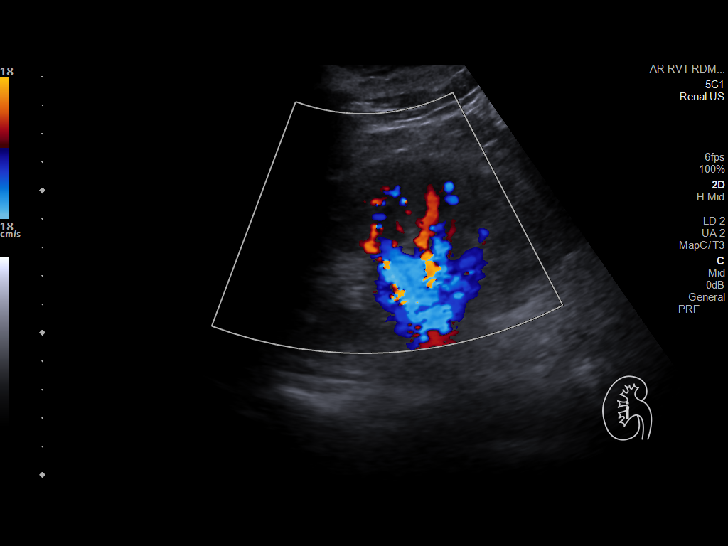
[im 7/42]
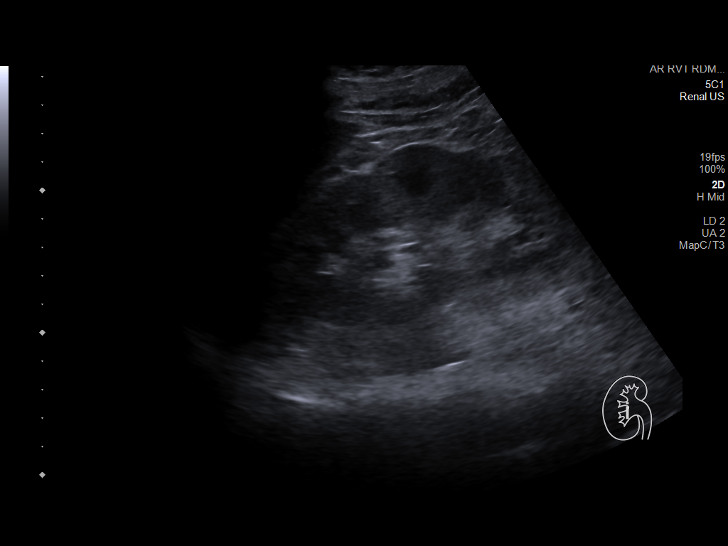
[im 11/42]
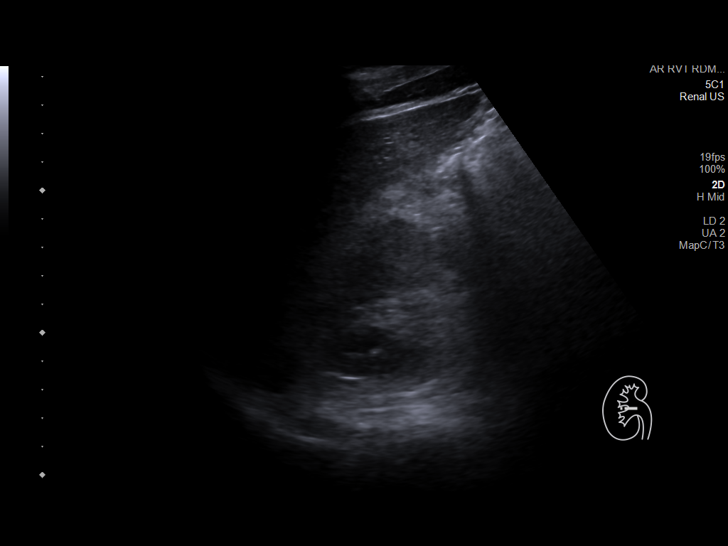
[im 14/42]
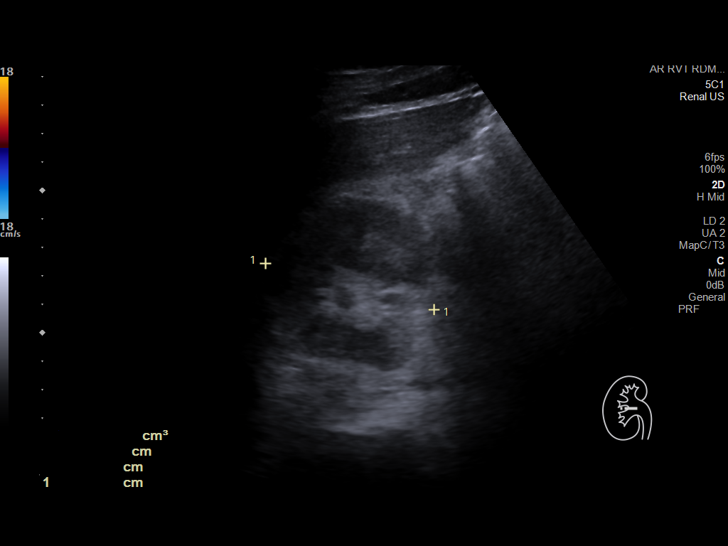
[im 16/42]
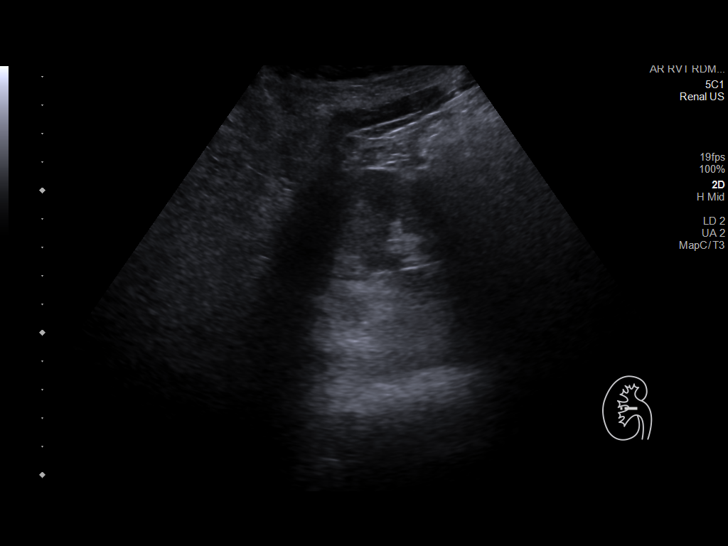
[im 19/42]
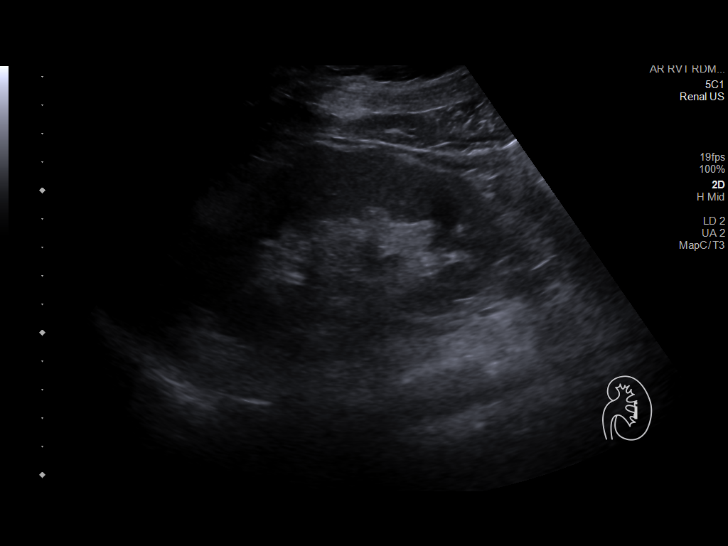
[im 23/42]
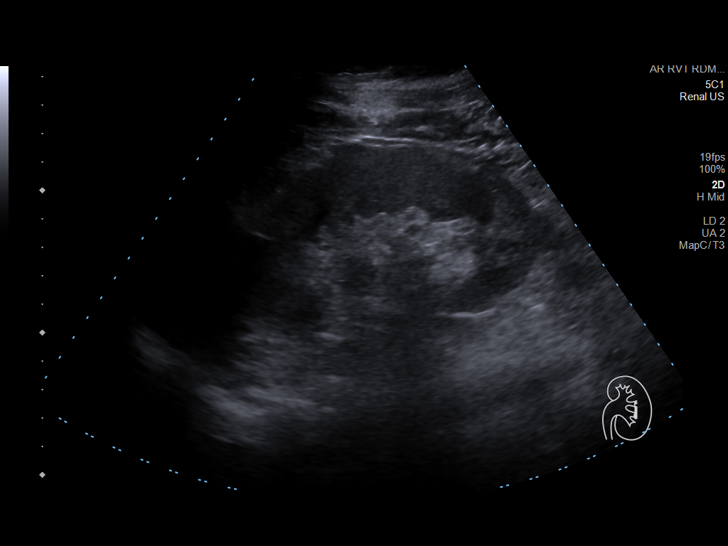
[im 26/42]
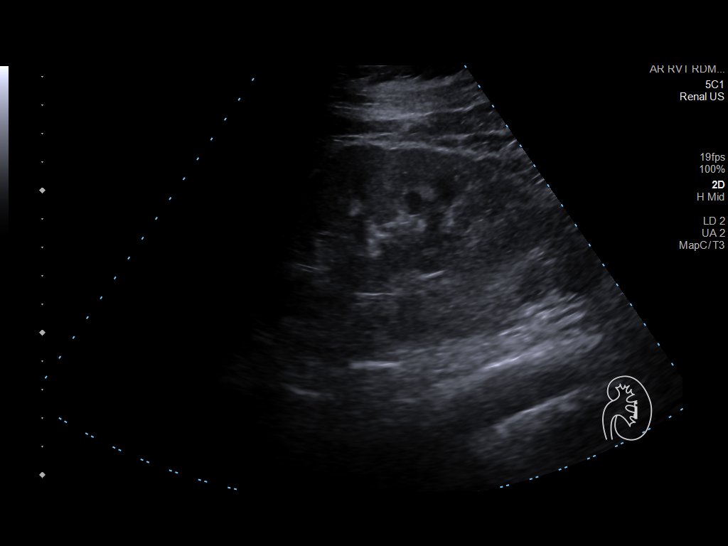
[im 28/42]
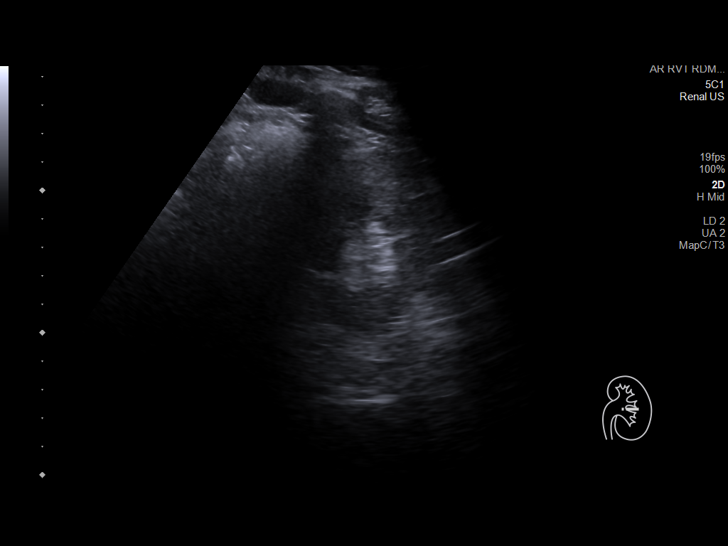
[im 31/42]
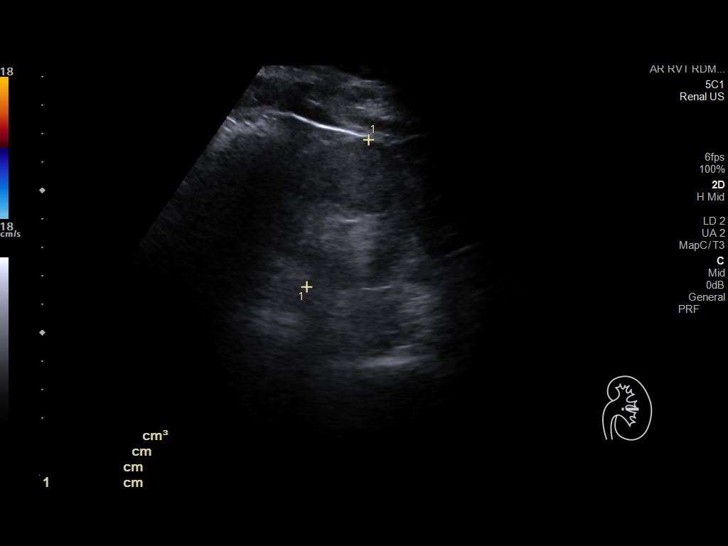
[im 35/42]
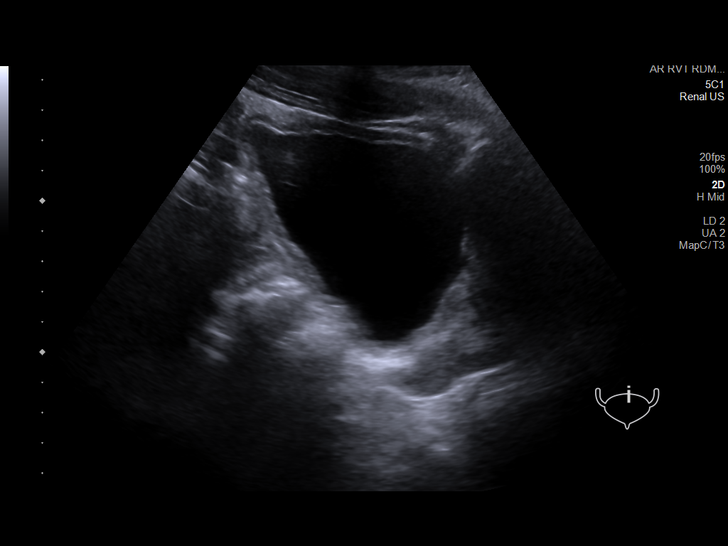
[im 38/42]
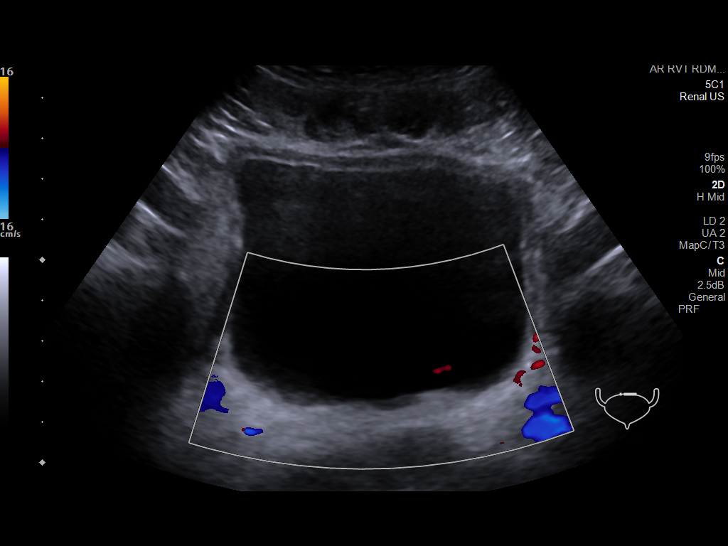
[im 42/42]
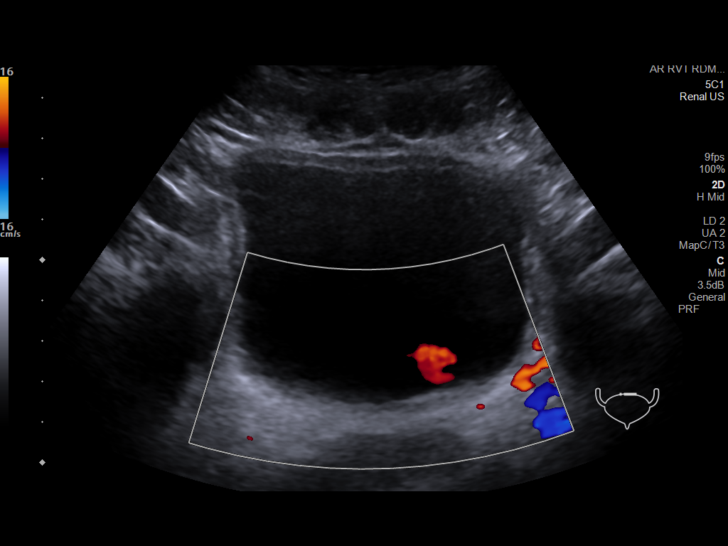

[14 of 25 positions shown; findings below may reference images not displayed]

FINDINGS: Right Kidney:

Renal measurements: 11.1 x 6.9 x 6.1 cm = volume: 246.2 mL.
Echogenicity within normal limits. No mass or hydronephrosis
visualized.

Left Kidney:

Renal measurements: 11.6 x 7.2 x 5.6 cm = volume: 245.7 mL.
Echogenicity within normal limits. No mass or hydronephrosis
visualized.

Bladder:

Appears normal for degree of bladder distention. Bilateral ureteral
jets noted.

Other:

None.
IMPRESSION: No acute or focal abnormality identified. No evidence of
hydronephrosis. Bilateral ureteral jets noted.
